# Patient Record
Sex: Male | Born: 1935 | Race: White | Hispanic: No | Marital: Single | Smoking: Former smoker
Health system: Southern US, Community
[De-identification: ages and names within clinical notes are randomized; demographics above are authoritative.]

## PROBLEM LIST (undated history)

## (undated) DIAGNOSIS — I480 Paroxysmal atrial fibrillation: Secondary | ICD-10-CM

## (undated) DIAGNOSIS — I10 Essential (primary) hypertension: Secondary | ICD-10-CM

## (undated) DIAGNOSIS — D45 Polycythemia vera: Principal | ICD-10-CM

## (undated) DIAGNOSIS — D751 Secondary polycythemia: Secondary | ICD-10-CM

## (undated) DIAGNOSIS — N2889 Other specified disorders of kidney and ureter: Secondary | ICD-10-CM

## (undated) HISTORY — DX: Secondary polycythemia: D75.1

## (undated) HISTORY — DX: Polycythemia vera: D45

---

## 2003-11-15 ENCOUNTER — Ambulatory Visit: Payer: Self-pay | Admitting: Internal Medicine

## 2003-12-16 ENCOUNTER — Ambulatory Visit: Payer: Self-pay | Admitting: Internal Medicine

## 2004-01-15 ENCOUNTER — Ambulatory Visit: Payer: Self-pay | Admitting: Internal Medicine

## 2004-02-15 ENCOUNTER — Ambulatory Visit: Payer: Self-pay | Admitting: Internal Medicine

## 2004-03-17 ENCOUNTER — Ambulatory Visit: Payer: Self-pay | Admitting: Internal Medicine

## 2004-04-14 ENCOUNTER — Ambulatory Visit: Payer: Self-pay | Admitting: Internal Medicine

## 2004-05-15 ENCOUNTER — Ambulatory Visit: Payer: Self-pay | Admitting: Internal Medicine

## 2004-06-14 ENCOUNTER — Ambulatory Visit: Payer: Self-pay | Admitting: Internal Medicine

## 2004-07-15 ENCOUNTER — Ambulatory Visit: Payer: Self-pay | Admitting: Internal Medicine

## 2004-08-14 ENCOUNTER — Ambulatory Visit: Payer: Self-pay | Admitting: Internal Medicine

## 2004-10-01 ENCOUNTER — Ambulatory Visit: Payer: Self-pay | Admitting: Internal Medicine

## 2004-10-15 ENCOUNTER — Ambulatory Visit: Payer: Self-pay | Admitting: Internal Medicine

## 2004-11-14 ENCOUNTER — Ambulatory Visit: Payer: Self-pay | Admitting: Internal Medicine

## 2004-12-24 ENCOUNTER — Ambulatory Visit: Payer: Self-pay | Admitting: Internal Medicine

## 2005-01-14 ENCOUNTER — Ambulatory Visit: Payer: Self-pay | Admitting: Internal Medicine

## 2005-02-14 ENCOUNTER — Ambulatory Visit: Payer: Self-pay | Admitting: Internal Medicine

## 2005-03-18 ENCOUNTER — Ambulatory Visit: Payer: Self-pay | Admitting: Internal Medicine

## 2005-04-14 ENCOUNTER — Ambulatory Visit: Payer: Self-pay | Admitting: Internal Medicine

## 2005-05-15 ENCOUNTER — Ambulatory Visit: Payer: Self-pay | Admitting: Internal Medicine

## 2005-06-14 ENCOUNTER — Ambulatory Visit: Payer: Self-pay | Admitting: Internal Medicine

## 2005-07-22 ENCOUNTER — Ambulatory Visit: Payer: Self-pay | Admitting: Internal Medicine

## 2005-08-14 ENCOUNTER — Ambulatory Visit: Payer: Self-pay | Admitting: Internal Medicine

## 2005-09-14 ENCOUNTER — Ambulatory Visit: Payer: Self-pay | Admitting: Internal Medicine

## 2005-10-15 ENCOUNTER — Ambulatory Visit: Payer: Self-pay | Admitting: Internal Medicine

## 2005-11-25 ENCOUNTER — Ambulatory Visit: Payer: Self-pay | Admitting: Internal Medicine

## 2005-12-15 ENCOUNTER — Ambulatory Visit: Payer: Self-pay | Admitting: Internal Medicine

## 2006-01-14 ENCOUNTER — Ambulatory Visit: Payer: Self-pay | Admitting: Internal Medicine

## 2006-02-17 ENCOUNTER — Ambulatory Visit: Payer: Self-pay | Admitting: Internal Medicine

## 2006-03-17 ENCOUNTER — Ambulatory Visit: Payer: Self-pay | Admitting: Internal Medicine

## 2006-04-15 ENCOUNTER — Ambulatory Visit: Payer: Self-pay | Admitting: Internal Medicine

## 2006-05-16 ENCOUNTER — Ambulatory Visit: Payer: Self-pay | Admitting: Internal Medicine

## 2006-06-21 ENCOUNTER — Ambulatory Visit: Payer: Self-pay | Admitting: Internal Medicine

## 2006-07-16 ENCOUNTER — Ambulatory Visit: Payer: Self-pay | Admitting: Internal Medicine

## 2006-08-15 ENCOUNTER — Ambulatory Visit: Payer: Self-pay | Admitting: Internal Medicine

## 2006-09-14 ENCOUNTER — Ambulatory Visit: Payer: Self-pay | Admitting: Internal Medicine

## 2006-09-15 ENCOUNTER — Ambulatory Visit: Payer: Self-pay | Admitting: Internal Medicine

## 2006-12-04 ENCOUNTER — Ambulatory Visit: Payer: Self-pay | Admitting: Internal Medicine

## 2006-12-16 ENCOUNTER — Ambulatory Visit: Payer: Self-pay | Admitting: Internal Medicine

## 2007-02-15 ENCOUNTER — Ambulatory Visit: Payer: Self-pay | Admitting: Internal Medicine

## 2007-03-01 ENCOUNTER — Ambulatory Visit: Payer: Self-pay | Admitting: Internal Medicine

## 2007-03-18 ENCOUNTER — Ambulatory Visit: Payer: Self-pay | Admitting: Internal Medicine

## 2007-04-15 ENCOUNTER — Ambulatory Visit: Payer: Self-pay | Admitting: Internal Medicine

## 2007-05-16 ENCOUNTER — Ambulatory Visit: Payer: Self-pay | Admitting: Internal Medicine

## 2007-06-15 ENCOUNTER — Ambulatory Visit: Payer: Self-pay | Admitting: Internal Medicine

## 2007-07-16 ENCOUNTER — Ambulatory Visit: Payer: Self-pay | Admitting: Internal Medicine

## 2007-08-16 ENCOUNTER — Ambulatory Visit: Payer: Self-pay | Admitting: Internal Medicine

## 2007-09-15 ENCOUNTER — Ambulatory Visit: Payer: Self-pay | Admitting: Internal Medicine

## 2007-10-16 ENCOUNTER — Ambulatory Visit: Payer: Self-pay | Admitting: Internal Medicine

## 2007-11-15 ENCOUNTER — Ambulatory Visit: Payer: Self-pay | Admitting: Internal Medicine

## 2007-12-20 ENCOUNTER — Ambulatory Visit: Payer: Self-pay | Admitting: Internal Medicine

## 2008-01-15 ENCOUNTER — Ambulatory Visit: Payer: Self-pay | Admitting: Internal Medicine

## 2008-02-15 ENCOUNTER — Ambulatory Visit: Payer: Self-pay | Admitting: Internal Medicine

## 2008-03-27 ENCOUNTER — Ambulatory Visit: Payer: Self-pay | Admitting: Internal Medicine

## 2008-04-14 ENCOUNTER — Ambulatory Visit: Payer: Self-pay | Admitting: Internal Medicine

## 2008-05-22 ENCOUNTER — Ambulatory Visit: Payer: Self-pay | Admitting: Internal Medicine

## 2008-06-14 ENCOUNTER — Ambulatory Visit: Payer: Self-pay | Admitting: Internal Medicine

## 2008-07-15 ENCOUNTER — Ambulatory Visit: Payer: Self-pay | Admitting: Internal Medicine

## 2008-07-17 ENCOUNTER — Ambulatory Visit: Payer: Self-pay | Admitting: Internal Medicine

## 2008-08-14 ENCOUNTER — Ambulatory Visit: Payer: Self-pay | Admitting: Internal Medicine

## 2008-09-14 ENCOUNTER — Ambulatory Visit: Payer: Self-pay | Admitting: Internal Medicine

## 2008-11-06 ENCOUNTER — Ambulatory Visit: Payer: Self-pay | Admitting: Internal Medicine

## 2008-11-14 ENCOUNTER — Ambulatory Visit: Payer: Self-pay | Admitting: Internal Medicine

## 2009-01-01 ENCOUNTER — Ambulatory Visit: Payer: Self-pay | Admitting: Internal Medicine

## 2009-01-14 ENCOUNTER — Ambulatory Visit: Payer: Self-pay | Admitting: Internal Medicine

## 2009-02-26 ENCOUNTER — Ambulatory Visit: Payer: Self-pay | Admitting: Internal Medicine

## 2009-03-17 ENCOUNTER — Ambulatory Visit: Payer: Self-pay | Admitting: Internal Medicine

## 2009-04-14 ENCOUNTER — Ambulatory Visit: Payer: Self-pay | Admitting: Internal Medicine

## 2009-04-23 ENCOUNTER — Ambulatory Visit: Payer: Self-pay | Admitting: Internal Medicine

## 2009-05-15 ENCOUNTER — Ambulatory Visit: Payer: Self-pay | Admitting: Internal Medicine

## 2009-07-16 ENCOUNTER — Ambulatory Visit: Payer: Self-pay | Admitting: Internal Medicine

## 2009-08-14 ENCOUNTER — Ambulatory Visit: Payer: Self-pay | Admitting: Internal Medicine

## 2009-09-14 ENCOUNTER — Ambulatory Visit: Payer: Self-pay | Admitting: Internal Medicine

## 2009-10-15 ENCOUNTER — Ambulatory Visit: Payer: Self-pay | Admitting: Internal Medicine

## 2009-12-31 ENCOUNTER — Ambulatory Visit: Payer: Self-pay | Admitting: Internal Medicine

## 2010-01-14 ENCOUNTER — Ambulatory Visit: Payer: Self-pay | Admitting: Internal Medicine

## 2010-03-25 ENCOUNTER — Ambulatory Visit: Payer: Self-pay | Admitting: Internal Medicine

## 2010-04-15 ENCOUNTER — Ambulatory Visit: Payer: Self-pay | Admitting: Internal Medicine

## 2010-06-17 ENCOUNTER — Ambulatory Visit: Payer: Self-pay | Admitting: Internal Medicine

## 2010-07-16 ENCOUNTER — Ambulatory Visit: Payer: Self-pay | Admitting: Internal Medicine

## 2010-09-09 ENCOUNTER — Ambulatory Visit: Payer: Self-pay | Admitting: Internal Medicine

## 2010-09-15 ENCOUNTER — Ambulatory Visit: Payer: Self-pay | Admitting: Internal Medicine

## 2010-12-02 ENCOUNTER — Ambulatory Visit: Payer: Self-pay | Admitting: Internal Medicine

## 2010-12-16 ENCOUNTER — Ambulatory Visit: Payer: Self-pay | Admitting: Internal Medicine

## 2011-02-24 ENCOUNTER — Ambulatory Visit: Payer: Self-pay | Admitting: Internal Medicine

## 2011-02-24 LAB — CBC CANCER CENTER
Basophil #: 0.2 x10 3/mm — ABNORMAL HIGH (ref 0.0–0.1)
Basophil %: 3.8 %
Eosinophil %: 4.4 %
HCT: 47.6 % (ref 40.0–52.0)
HGB: 16.7 g/dL (ref 13.0–18.0)
Lymphocyte #: 1.2 x10 3/mm (ref 1.0–3.6)
Lymphocyte %: 17.8 %
MCV: 100.7 fL — ABNORMAL HIGH (ref 80–100)
Monocyte #: 1 x10 3/mm — ABNORMAL HIGH (ref 0.0–0.7)
Monocyte %: 15.3 %
Neutrophil #: 3.8 x10 3/mm (ref 1.4–6.5)
Neutrophil %: 58.7 %
RBC: 4.72 10*6/uL (ref 4.40–5.90)
WBC: 6.5 x10 3/mm (ref 3.8–10.6)

## 2011-02-24 LAB — FERRITIN: Ferritin (ARMC): 25 ng/mL (ref 8–388)

## 2011-03-18 ENCOUNTER — Ambulatory Visit: Payer: Self-pay | Admitting: Internal Medicine

## 2011-05-19 ENCOUNTER — Ambulatory Visit: Payer: Self-pay | Admitting: Internal Medicine

## 2011-05-19 LAB — CBC CANCER CENTER
Basophil %: 1.1 %
Eosinophil #: 0.5 x10 3/mm (ref 0.0–0.7)
Eosinophil %: 7.2 %
HCT: 47.3 % (ref 40.0–52.0)
HGB: 15.8 g/dL (ref 13.0–18.0)
Lymphocyte #: 1.2 x10 3/mm (ref 1.0–3.6)
Lymphocyte %: 16 %
MCH: 32.2 pg (ref 26.0–34.0)
MCHC: 33.3 g/dL (ref 32.0–36.0)
MCV: 96.7 fL (ref 80–100)
Monocyte #: 0.8 x10 3/mm — ABNORMAL HIGH (ref 0.0–0.7)
Monocyte %: 10.3 %
Neutrophil %: 65.4 %
RBC: 4.89 10*6/uL (ref 4.40–5.90)
WBC: 7.3 x10 3/mm (ref 3.8–10.6)

## 2011-06-15 ENCOUNTER — Ambulatory Visit: Payer: Self-pay | Admitting: Internal Medicine

## 2011-08-11 ENCOUNTER — Ambulatory Visit: Payer: Self-pay | Admitting: Internal Medicine

## 2011-08-11 LAB — CBC CANCER CENTER
Basophil #: 0.1 x10 3/mm (ref 0.0–0.1)
Eosinophil %: 5.4 %
HGB: 15 g/dL (ref 13.0–18.0)
Lymphocyte #: 1.2 x10 3/mm (ref 1.0–3.6)
Lymphocyte %: 17.4 %
MCH: 30.4 pg (ref 26.0–34.0)
MCV: 89 fL (ref 80–100)
Monocyte #: 0.9 x10 3/mm (ref 0.2–1.0)
Neutrophil #: 4.4 x10 3/mm (ref 1.4–6.5)
Neutrophil %: 63.1 %
Platelet: 153 x10 3/mm (ref 150–440)
RDW: 15.7 % — ABNORMAL HIGH (ref 11.5–14.5)
WBC: 7 x10 3/mm (ref 3.8–10.6)

## 2011-08-15 ENCOUNTER — Ambulatory Visit: Payer: Self-pay | Admitting: Internal Medicine

## 2011-11-03 ENCOUNTER — Ambulatory Visit: Payer: Self-pay | Admitting: Internal Medicine

## 2011-11-03 LAB — CBC CANCER CENTER
Basophil #: 0.1 x10 3/mm (ref 0.0–0.1)
Eosinophil #: 0.5 x10 3/mm (ref 0.0–0.7)
HCT: 44.4 % (ref 40.0–52.0)
Lymphocyte #: 1.3 x10 3/mm (ref 1.0–3.6)
MCH: 31.4 pg (ref 26.0–34.0)
MCHC: 34.6 g/dL (ref 32.0–36.0)
MCV: 91 fL (ref 80–100)
Monocyte #: 0.9 x10 3/mm (ref 0.2–1.0)
Platelet: 143 x10 3/mm — ABNORMAL LOW (ref 150–440)
RDW: 15.7 % — ABNORMAL HIGH (ref 11.5–14.5)
WBC: 7.2 x10 3/mm (ref 3.8–10.6)

## 2011-11-15 ENCOUNTER — Ambulatory Visit: Payer: Self-pay | Admitting: Internal Medicine

## 2012-01-26 ENCOUNTER — Ambulatory Visit: Payer: Self-pay | Admitting: Internal Medicine

## 2012-01-26 LAB — CBC CANCER CENTER
Eosinophil %: 7.1 %
HCT: 48.6 % (ref 40.0–52.0)
HGB: 16.8 g/dL (ref 13.0–18.0)
Lymphocyte #: 1.4 x10 3/mm (ref 1.0–3.6)
Lymphocyte %: 20.1 %
MCHC: 34.6 g/dL (ref 32.0–36.0)
Monocyte %: 13.1 %
Neutrophil #: 4 x10 3/mm (ref 1.4–6.5)
Neutrophil %: 58.8 %
Platelet: 145 x10 3/mm — ABNORMAL LOW (ref 150–440)
RDW: 15.8 % — ABNORMAL HIGH (ref 11.5–14.5)
WBC: 6.8 x10 3/mm (ref 3.8–10.6)

## 2012-02-15 ENCOUNTER — Ambulatory Visit: Payer: Self-pay | Admitting: Internal Medicine

## 2012-04-19 ENCOUNTER — Ambulatory Visit: Payer: Self-pay | Admitting: Internal Medicine

## 2012-04-19 LAB — CBC CANCER CENTER
Basophil #: 0.1 x10 3/mm (ref 0.0–0.1)
Eosinophil #: 0.5 x10 3/mm (ref 0.0–0.7)
Eosinophil %: 7.1 %
HCT: 45.7 % (ref 40.0–52.0)
Lymphocyte #: 1.2 x10 3/mm (ref 1.0–3.6)
Lymphocyte %: 18.7 %
MCH: 33.1 pg (ref 26.0–34.0)
MCHC: 34.6 g/dL (ref 32.0–36.0)
MCV: 96 fL (ref 80–100)
Monocyte #: 0.9 x10 3/mm (ref 0.2–1.0)
Monocyte %: 13.7 %
Neutrophil #: 3.8 x10 3/mm (ref 1.4–6.5)
Neutrophil %: 58.3 %
Platelet: 140 x10 3/mm — ABNORMAL LOW (ref 150–440)

## 2012-05-15 ENCOUNTER — Ambulatory Visit: Payer: Self-pay | Admitting: Internal Medicine

## 2012-07-12 ENCOUNTER — Ambulatory Visit: Payer: Self-pay | Admitting: Internal Medicine

## 2012-07-12 LAB — CBC CANCER CENTER
Basophil #: 0.1 x10 3/mm (ref 0.0–0.1)
Eosinophil #: 0.6 x10 3/mm (ref 0.0–0.7)
HGB: 16.2 g/dL (ref 13.0–18.0)
Lymphocyte #: 1.2 x10 3/mm (ref 1.0–3.6)
Lymphocyte %: 15.7 %
MCHC: 35.3 g/dL (ref 32.0–36.0)
MCV: 95 fL (ref 80–100)
Monocyte #: 0.9 x10 3/mm (ref 0.2–1.0)
Monocyte %: 12.7 %
Neutrophil %: 61.8 %
Platelet: 125 x10 3/mm — ABNORMAL LOW (ref 150–440)
RDW: 14.8 % — ABNORMAL HIGH (ref 11.5–14.5)
WBC: 7.4 x10 3/mm (ref 3.8–10.6)

## 2012-07-15 ENCOUNTER — Ambulatory Visit: Payer: Self-pay | Admitting: Internal Medicine

## 2012-10-04 ENCOUNTER — Ambulatory Visit: Payer: Self-pay | Admitting: Internal Medicine

## 2012-10-04 LAB — CBC CANCER CENTER
Basophil #: 0.1 x10 3/mm (ref 0.0–0.1)
Basophil %: 1.2 %
HCT: 48 % (ref 40.0–52.0)
HGB: 17 g/dL (ref 13.0–18.0)
Lymphocyte #: 1.1 x10 3/mm (ref 1.0–3.6)
MCH: 34.7 pg — ABNORMAL HIGH (ref 26.0–34.0)
Neutrophil #: 4.7 x10 3/mm (ref 1.4–6.5)
Neutrophil %: 62.9 %
Platelet: 125 x10 3/mm — ABNORMAL LOW (ref 150–440)

## 2012-10-15 ENCOUNTER — Ambulatory Visit: Payer: Self-pay | Admitting: Internal Medicine

## 2012-12-27 ENCOUNTER — Ambulatory Visit: Payer: Self-pay | Admitting: Internal Medicine

## 2012-12-27 LAB — CBC CANCER CENTER
Basophil #: 0.1 x10 3/mm (ref 0.0–0.1)
Eosinophil %: 7 %
HCT: 50.6 % (ref 40.0–52.0)
Lymphocyte #: 1.4 x10 3/mm (ref 1.0–3.6)
MCHC: 34.4 g/dL (ref 32.0–36.0)
MCV: 97 fL (ref 80–100)
Neutrophil #: 4.3 x10 3/mm (ref 1.4–6.5)
Neutrophil %: 63.5 %
RBC: 5.21 10*6/uL (ref 4.40–5.90)
RDW: 13.5 % (ref 11.5–14.5)

## 2012-12-27 LAB — FERRITIN: Ferritin (ARMC): 22 ng/mL (ref 8–388)

## 2013-01-14 ENCOUNTER — Ambulatory Visit: Payer: Self-pay | Admitting: Internal Medicine

## 2013-02-21 ENCOUNTER — Ambulatory Visit: Payer: Self-pay | Admitting: Internal Medicine

## 2013-02-21 LAB — CBC CANCER CENTER
BASOS ABS: 0.1 x10 3/mm (ref 0.0–0.1)
BASOS PCT: 1.2 %
Eosinophil #: 0.3 x10 3/mm (ref 0.0–0.7)
Eosinophil %: 4.5 %
HCT: 46.8 % (ref 40.0–52.0)
HGB: 16 g/dL (ref 13.0–18.0)
Lymphocyte #: 1.1 x10 3/mm (ref 1.0–3.6)
Lymphocyte %: 15.7 %
MCH: 32.1 pg (ref 26.0–34.0)
MCHC: 34.1 g/dL (ref 32.0–36.0)
MCV: 94 fL (ref 80–100)
Monocyte #: 0.9 x10 3/mm (ref 0.2–1.0)
Monocyte %: 14.1 %
NEUTROS PCT: 64.5 %
Neutrophil #: 4.3 x10 3/mm (ref 1.4–6.5)
Platelet: 141 x10 3/mm — ABNORMAL LOW (ref 150–440)
RBC: 4.96 10*6/uL (ref 4.40–5.90)
RDW: 14.4 % (ref 11.5–14.5)
WBC: 6.7 x10 3/mm (ref 3.8–10.6)

## 2013-03-17 ENCOUNTER — Ambulatory Visit: Payer: Self-pay | Admitting: Internal Medicine

## 2013-04-18 ENCOUNTER — Ambulatory Visit: Payer: Self-pay | Admitting: Internal Medicine

## 2013-04-18 LAB — CBC CANCER CENTER
Basophil #: 0.1 x10 3/mm (ref 0.0–0.1)
Basophil %: 1.3 %
EOS PCT: 6.1 %
Eosinophil #: 0.4 x10 3/mm (ref 0.0–0.7)
HCT: 42.3 % (ref 40.0–52.0)
HGB: 14.3 g/dL (ref 13.0–18.0)
Lymphocyte #: 1.4 x10 3/mm (ref 1.0–3.6)
Lymphocyte %: 20.5 %
MCH: 30.7 pg (ref 26.0–34.0)
MCHC: 33.9 g/dL (ref 32.0–36.0)
MCV: 91 fL (ref 80–100)
MONOS PCT: 14 %
Monocyte #: 1 x10 3/mm (ref 0.2–1.0)
Neutrophil #: 4.1 x10 3/mm (ref 1.4–6.5)
Neutrophil %: 58.1 %
PLATELETS: 153 x10 3/mm (ref 150–440)
RBC: 4.66 10*6/uL (ref 4.40–5.90)
RDW: 14.4 % (ref 11.5–14.5)
WBC: 7 x10 3/mm (ref 3.8–10.6)

## 2013-05-15 ENCOUNTER — Ambulatory Visit: Payer: Self-pay | Admitting: Internal Medicine

## 2013-06-13 ENCOUNTER — Ambulatory Visit: Payer: Self-pay | Admitting: Internal Medicine

## 2013-06-13 LAB — CBC CANCER CENTER
BASOS PCT: 0.6 %
Basophil #: 0 x10 3/mm (ref 0.0–0.1)
EOS ABS: 0.5 x10 3/mm (ref 0.0–0.7)
Eosinophil %: 7.8 %
HCT: 43.7 % (ref 40.0–52.0)
HGB: 14.7 g/dL (ref 13.0–18.0)
LYMPHS ABS: 1.2 x10 3/mm (ref 1.0–3.6)
LYMPHS PCT: 17.5 %
MCH: 29.9 pg (ref 26.0–34.0)
MCHC: 33.6 g/dL (ref 32.0–36.0)
MCV: 89 fL (ref 80–100)
MONO ABS: 1 x10 3/mm (ref 0.2–1.0)
Monocyte %: 14.6 %
Neutrophil #: 4.1 x10 3/mm (ref 1.4–6.5)
Neutrophil %: 59.5 %
Platelet: 145 x10 3/mm — ABNORMAL LOW (ref 150–440)
RBC: 4.92 10*6/uL (ref 4.40–5.90)
RDW: 14.7 % — AB (ref 11.5–14.5)
WBC: 6.9 x10 3/mm (ref 3.8–10.6)

## 2013-06-14 ENCOUNTER — Ambulatory Visit: Payer: Self-pay | Admitting: Internal Medicine

## 2013-08-08 ENCOUNTER — Ambulatory Visit: Payer: Self-pay | Admitting: Internal Medicine

## 2013-08-08 LAB — CBC CANCER CENTER
BASOS ABS: 0.1 x10 3/mm (ref 0.0–0.1)
Basophil %: 1.3 %
EOS ABS: 0.3 x10 3/mm (ref 0.0–0.7)
Eosinophil %: 5 %
HCT: 47.1 % (ref 40.0–52.0)
HGB: 16.2 g/dL (ref 13.0–18.0)
LYMPHS ABS: 1.3 x10 3/mm (ref 1.0–3.6)
Lymphocyte %: 20.1 %
MCH: 30.5 pg (ref 26.0–34.0)
MCHC: 34.5 g/dL (ref 32.0–36.0)
MCV: 89 fL (ref 80–100)
MONO ABS: 0.9 x10 3/mm (ref 0.2–1.0)
Monocyte %: 14.4 %
Neutrophil #: 3.7 x10 3/mm (ref 1.4–6.5)
Neutrophil %: 59.2 %
PLATELETS: 138 x10 3/mm — AB (ref 150–440)
RBC: 5.32 10*6/uL (ref 4.40–5.90)
RDW: 16.1 % — AB (ref 11.5–14.5)
WBC: 6.3 x10 3/mm (ref 3.8–10.6)

## 2013-08-14 ENCOUNTER — Ambulatory Visit: Payer: Self-pay | Admitting: Internal Medicine

## 2013-10-03 ENCOUNTER — Ambulatory Visit: Payer: Self-pay | Admitting: Internal Medicine

## 2013-10-03 LAB — CBC CANCER CENTER
BASOS ABS: 0.1 x10 3/mm (ref 0.0–0.1)
Basophil %: 1.2 %
EOS PCT: 5.3 %
Eosinophil #: 0.4 x10 3/mm (ref 0.0–0.7)
HCT: 41.6 % (ref 40.0–52.0)
HGB: 14.3 g/dL (ref 13.0–18.0)
LYMPHS ABS: 1 x10 3/mm (ref 1.0–3.6)
Lymphocyte %: 15.1 %
MCH: 30.4 pg (ref 26.0–34.0)
MCHC: 34.3 g/dL (ref 32.0–36.0)
MCV: 89 fL (ref 80–100)
MONO ABS: 0.9 x10 3/mm (ref 0.2–1.0)
Monocyte %: 13.5 %
NEUTROS ABS: 4.4 x10 3/mm (ref 1.4–6.5)
NEUTROS PCT: 64.9 %
Platelet: 143 x10 3/mm — ABNORMAL LOW (ref 150–440)
RBC: 4.69 10*6/uL (ref 4.40–5.90)
RDW: 15.2 % — ABNORMAL HIGH (ref 11.5–14.5)
WBC: 6.8 x10 3/mm (ref 3.8–10.6)

## 2013-10-15 ENCOUNTER — Ambulatory Visit: Payer: Self-pay | Admitting: Internal Medicine

## 2013-11-28 ENCOUNTER — Ambulatory Visit: Payer: Self-pay | Admitting: Internal Medicine

## 2013-11-28 LAB — CBC CANCER CENTER
BASOS PCT: 1.1 %
Basophil #: 0.1 x10 3/mm (ref 0.0–0.1)
Eosinophil #: 0.3 x10 3/mm (ref 0.0–0.7)
Eosinophil %: 4.7 %
HCT: 43.1 % (ref 40.0–52.0)
HGB: 15.1 g/dL (ref 13.0–18.0)
Lymphocyte #: 1.2 x10 3/mm (ref 1.0–3.6)
Lymphocyte %: 16.6 %
MCH: 30.6 pg (ref 26.0–34.0)
MCHC: 35.1 g/dL (ref 32.0–36.0)
MCV: 87 fL (ref 80–100)
Monocyte #: 1 x10 3/mm (ref 0.2–1.0)
Monocyte %: 14.1 %
NEUTROS PCT: 63.5 %
Neutrophil #: 4.4 x10 3/mm (ref 1.4–6.5)
Platelet: 150 x10 3/mm (ref 150–440)
RBC: 4.95 10*6/uL (ref 4.40–5.90)
RDW: 15.4 % — AB (ref 11.5–14.5)
WBC: 7 x10 3/mm (ref 3.8–10.6)

## 2013-11-28 LAB — FERRITIN: FERRITIN (ARMC): 21 ng/mL (ref 8–388)

## 2013-12-15 ENCOUNTER — Ambulatory Visit: Payer: Self-pay | Admitting: Internal Medicine

## 2014-05-01 ENCOUNTER — Ambulatory Visit
Admit: 2014-05-01 | Disposition: A | Discharge status: Home / Self Care | Payer: Self-pay | Attending: Internal Medicine | Admitting: Internal Medicine

## 2014-05-01 DIAGNOSIS — D45 Polycythemia vera: Secondary | ICD-10-CM | POA: Diagnosis not present

## 2014-05-16 ENCOUNTER — Ambulatory Visit
Admit: 2014-05-16 | Disposition: A | Discharge status: Home / Self Care | Payer: Self-pay | Attending: Internal Medicine | Admitting: Internal Medicine

## 2014-07-04 ENCOUNTER — Emergency Department: Payer: Medicare Other

## 2014-07-04 ENCOUNTER — Encounter: Payer: Self-pay | Admitting: Emergency Medicine

## 2014-07-04 ENCOUNTER — Observation Stay
Admission: EM | Admit: 2014-07-04 | Discharge: 2014-07-06 | Disposition: A | Discharge status: Home / Self Care | Payer: Medicare Other | Attending: Internal Medicine | Admitting: Internal Medicine

## 2014-07-04 DIAGNOSIS — K4090 Unilateral inguinal hernia, without obstruction or gangrene, not specified as recurrent: Secondary | ICD-10-CM | POA: Diagnosis not present

## 2014-07-04 DIAGNOSIS — W19XXXA Unspecified fall, initial encounter: Secondary | ICD-10-CM | POA: Diagnosis not present

## 2014-07-04 DIAGNOSIS — Y92481 Parking lot as the place of occurrence of the external cause: Secondary | ICD-10-CM | POA: Diagnosis not present

## 2014-07-04 DIAGNOSIS — Z79899 Other long term (current) drug therapy: Secondary | ICD-10-CM | POA: Diagnosis not present

## 2014-07-04 DIAGNOSIS — R410 Disorientation, unspecified: Secondary | ICD-10-CM | POA: Diagnosis not present

## 2014-07-04 DIAGNOSIS — S3993XA Unspecified injury of pelvis, initial encounter: Secondary | ICD-10-CM | POA: Diagnosis not present

## 2014-07-04 DIAGNOSIS — I313 Pericardial effusion (noninflammatory): Secondary | ICD-10-CM | POA: Insufficient documentation

## 2014-07-04 DIAGNOSIS — M4802 Spinal stenosis, cervical region: Secondary | ICD-10-CM | POA: Diagnosis not present

## 2014-07-04 DIAGNOSIS — E871 Hypo-osmolality and hyponatremia: Secondary | ICD-10-CM | POA: Insufficient documentation

## 2014-07-04 DIAGNOSIS — F1010 Alcohol abuse, uncomplicated: Secondary | ICD-10-CM | POA: Diagnosis not present

## 2014-07-04 DIAGNOSIS — I10 Essential (primary) hypertension: Secondary | ICD-10-CM | POA: Insufficient documentation

## 2014-07-04 DIAGNOSIS — S3991XA Unspecified injury of abdomen, initial encounter: Secondary | ICD-10-CM | POA: Diagnosis not present

## 2014-07-04 DIAGNOSIS — R9431 Abnormal electrocardiogram [ECG] [EKG]: Secondary | ICD-10-CM | POA: Diagnosis not present

## 2014-07-04 DIAGNOSIS — R251 Tremor, unspecified: Secondary | ICD-10-CM | POA: Diagnosis not present

## 2014-07-04 DIAGNOSIS — N2889 Other specified disorders of kidney and ureter: Secondary | ICD-10-CM | POA: Insufficient documentation

## 2014-07-04 DIAGNOSIS — S51012A Laceration without foreign body of left elbow, initial encounter: Secondary | ICD-10-CM

## 2014-07-04 DIAGNOSIS — Y900 Blood alcohol level of less than 20 mg/100 ml: Secondary | ICD-10-CM | POA: Insufficient documentation

## 2014-07-04 DIAGNOSIS — S51019A Laceration without foreign body of unspecified elbow, initial encounter: Secondary | ICD-10-CM | POA: Insufficient documentation

## 2014-07-04 DIAGNOSIS — Z87891 Personal history of nicotine dependence: Secondary | ICD-10-CM | POA: Diagnosis not present

## 2014-07-04 DIAGNOSIS — R4182 Altered mental status, unspecified: Secondary | ICD-10-CM | POA: Insufficient documentation

## 2014-07-04 DIAGNOSIS — K5730 Diverticulosis of large intestine without perforation or abscess without bleeding: Secondary | ICD-10-CM | POA: Insufficient documentation

## 2014-07-04 DIAGNOSIS — S299XXA Unspecified injury of thorax, initial encounter: Secondary | ICD-10-CM | POA: Diagnosis not present

## 2014-07-04 DIAGNOSIS — Z7982 Long term (current) use of aspirin: Secondary | ICD-10-CM | POA: Insufficient documentation

## 2014-07-04 DIAGNOSIS — S51011A Laceration without foreign body of right elbow, initial encounter: Secondary | ICD-10-CM

## 2014-07-04 DIAGNOSIS — S199XXA Unspecified injury of neck, initial encounter: Secondary | ICD-10-CM | POA: Diagnosis not present

## 2014-07-04 DIAGNOSIS — N400 Enlarged prostate without lower urinary tract symptoms: Secondary | ICD-10-CM | POA: Diagnosis not present

## 2014-07-04 DIAGNOSIS — I340 Nonrheumatic mitral (valve) insufficiency: Secondary | ICD-10-CM | POA: Insufficient documentation

## 2014-07-04 DIAGNOSIS — I2510 Atherosclerotic heart disease of native coronary artery without angina pectoris: Secondary | ICD-10-CM | POA: Insufficient documentation

## 2014-07-04 DIAGNOSIS — S0990XA Unspecified injury of head, initial encounter: Secondary | ICD-10-CM | POA: Diagnosis not present

## 2014-07-04 DIAGNOSIS — M5136 Other intervertebral disc degeneration, lumbar region: Secondary | ICD-10-CM | POA: Insufficient documentation

## 2014-07-04 DIAGNOSIS — R945 Abnormal results of liver function studies: Secondary | ICD-10-CM | POA: Insufficient documentation

## 2014-07-04 DIAGNOSIS — R17 Unspecified jaundice: Secondary | ICD-10-CM

## 2014-07-04 HISTORY — DX: Essential (primary) hypertension: I10

## 2014-07-04 LAB — URINALYSIS COMPLETE WITH MICROSCOPIC (ARMC ONLY)
Bilirubin Urine: NEGATIVE
Glucose, UA: NEGATIVE mg/dL
Hgb urine dipstick: NEGATIVE
Ketones, ur: NEGATIVE mg/dL
Leukocytes, UA: NEGATIVE
Nitrite: NEGATIVE
PROTEIN: 100 mg/dL — AB
SQUAMOUS EPITHELIAL / LPF: NONE SEEN
Specific Gravity, Urine: 1.01 (ref 1.005–1.030)
pH: 8 (ref 5.0–8.0)

## 2014-07-04 LAB — CBC WITH DIFFERENTIAL/PLATELET
Basophils Absolute: 0.1 10*3/uL (ref 0–0.1)
Basophils Relative: 1 %
Eosinophils Absolute: 0 10*3/uL (ref 0–0.7)
Eosinophils Relative: 0 %
HCT: 41.2 % (ref 40.0–52.0)
HEMOGLOBIN: 14.8 g/dL (ref 13.0–18.0)
Lymphocytes Relative: 5 %
Lymphs Abs: 0.7 10*3/uL — ABNORMAL LOW (ref 1.0–3.6)
MCH: 37.3 pg — ABNORMAL HIGH (ref 26.0–34.0)
MCHC: 35.9 g/dL (ref 32.0–36.0)
MCV: 103.8 fL — ABNORMAL HIGH (ref 80.0–100.0)
MONO ABS: 1 10*3/uL (ref 0.2–1.0)
NEUTROS ABS: 11.1 10*3/uL — AB (ref 1.4–6.5)
Platelets: 167 10*3/uL (ref 150–440)
RBC: 3.97 MIL/uL — ABNORMAL LOW (ref 4.40–5.90)
RDW: 15.5 % — ABNORMAL HIGH (ref 11.5–14.5)
WBC: 12.9 10*3/uL — AB (ref 3.8–10.6)

## 2014-07-04 LAB — URINE DRUG SCREEN, QUALITATIVE (ARMC ONLY)
Amphetamines, Ur Screen: NOT DETECTED
BARBITURATES, UR SCREEN: NOT DETECTED
BENZODIAZEPINE, UR SCRN: NOT DETECTED
CANNABINOID 50 NG, UR ~~LOC~~: NOT DETECTED
Cocaine Metabolite,Ur ~~LOC~~: NOT DETECTED
MDMA (Ecstasy)Ur Screen: NOT DETECTED
METHADONE SCREEN, URINE: NOT DETECTED
Opiate, Ur Screen: NOT DETECTED
PHENCYCLIDINE (PCP) UR S: NOT DETECTED
Tricyclic, Ur Screen: NOT DETECTED

## 2014-07-04 LAB — COMPREHENSIVE METABOLIC PANEL
ALK PHOS: 57 U/L (ref 38–126)
ALT: 26 U/L (ref 17–63)
ANION GAP: 10 (ref 5–15)
AST: 43 U/L — ABNORMAL HIGH (ref 15–41)
Albumin: 4.1 g/dL (ref 3.5–5.0)
BUN: 14 mg/dL (ref 6–20)
CALCIUM: 9.5 mg/dL (ref 8.9–10.3)
CO2: 25 mmol/L (ref 22–32)
Chloride: 97 mmol/L — ABNORMAL LOW (ref 101–111)
Creatinine, Ser: 0.98 mg/dL (ref 0.61–1.24)
GFR calc Af Amer: >60 mL/min (ref >60)
GFR calc non Af Amer: >60 mL/min (ref >60)
Glucose, Bld: 130 mg/dL — ABNORMAL HIGH (ref 65–99)
POTASSIUM: 4 mmol/L (ref 3.5–5.1)
Sodium: 132 mmol/L — ABNORMAL LOW (ref 135–145)
TOTAL PROTEIN: 6.9 g/dL (ref 6.5–8.1)
Total Bilirubin: 3.1 mg/dL — ABNORMAL HIGH (ref 0.3–1.2)

## 2014-07-04 LAB — TYPE AND SCREEN
ABO/RH(D): O POS
Antibody Screen: NEGATIVE

## 2014-07-04 LAB — TROPONIN I: Troponin I: <0.03 ng/mL (ref <0.031)

## 2014-07-04 LAB — APTT: APTT: 33 s (ref 24–36)

## 2014-07-04 LAB — PROTIME-INR
INR: 1.2
PROTHROMBIN TIME: 15.4 s — AB (ref 11.4–15.0)

## 2014-07-04 LAB — LIPASE, BLOOD: Lipase: 49 U/L (ref 22–51)

## 2014-07-04 LAB — ETHANOL: Alcohol, Ethyl (B): <5 mg/dL (ref <5)

## 2014-07-04 MED ORDER — IOHEXOL 300 MG/ML  SOLN
100.0000 mL | Freq: Once | INTRAMUSCULAR | Status: AC | PRN
  Administered 2014-07-04: 100 mL via INTRAVENOUS

## 2014-07-04 MED ORDER — SODIUM CHLORIDE 0.9 % IV BOLUS (SEPSIS)
1000.0000 mL | Freq: Once | INTRAVENOUS | Status: AC
  Administered 2014-07-04: 1000 mL via INTRAVENOUS

## 2014-07-04 MED ORDER — TETANUS-DIPHTHERIA TOXOIDS TD 5-2 LFU IM INJ
0.5000 mL | INJECTION | Freq: Once | INTRAMUSCULAR | Status: AC
  Administered 2014-07-04: 0.5 mL via INTRAMUSCULAR

## 2014-07-04 MED ORDER — TETANUS-DIPHTH-ACELL PERTUSSIS 5-2.5-18.5 LF-MCG/0.5 IM SUSP
INTRAMUSCULAR | Status: AC
Start: 2014-07-04 — End: 2014-07-05
  Filled 2014-07-04: qty 0.5
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
## 2014-07-05 ENCOUNTER — Encounter: Payer: Self-pay | Admitting: Emergency Medicine

## 2014-07-05 ENCOUNTER — Observation Stay: Admit: 2014-07-05 | Payer: Medicare Other

## 2014-07-05 ENCOUNTER — Observation Stay
Admit: 2014-07-05 | Discharge: 2014-07-05 | Disposition: A | Discharge status: Home / Self Care | Payer: Medicare Other | Attending: Internal Medicine | Admitting: Internal Medicine

## 2014-07-05 ENCOUNTER — Observation Stay: Payer: Medicare Other

## 2014-07-05 DIAGNOSIS — I6523 Occlusion and stenosis of bilateral carotid arteries: Secondary | ICD-10-CM | POA: Diagnosis not present

## 2014-07-05 DIAGNOSIS — S0990XA Unspecified injury of head, initial encounter: Secondary | ICD-10-CM | POA: Diagnosis not present

## 2014-07-05 DIAGNOSIS — I10 Essential (primary) hypertension: Secondary | ICD-10-CM | POA: Diagnosis not present

## 2014-07-05 DIAGNOSIS — R410 Disorientation, unspecified: Secondary | ICD-10-CM | POA: Diagnosis not present

## 2014-07-05 DIAGNOSIS — N2889 Other specified disorders of kidney and ureter: Secondary | ICD-10-CM | POA: Diagnosis not present

## 2014-07-05 DIAGNOSIS — I638 Other cerebral infarction: Secondary | ICD-10-CM | POA: Diagnosis not present

## 2014-07-05 LAB — TSH: TSH: 0.908 u[IU]/mL (ref 0.350–4.500)

## 2014-07-05 LAB — ABO/RH
ABO/RH(D): O POS
WEAK D: POSITIVE

## 2014-07-05 MED ORDER — VITAMIN B-1 100 MG PO TABS
100.0000 mg | ORAL_TABLET | Freq: Every day | ORAL | Status: DC
  Administered 2014-07-05 – 2014-07-06 (×2): 100 mg via ORAL
  Filled 2014-07-05 (×2): qty 1

## 2014-07-05 MED ORDER — ZOLPIDEM TARTRATE 5 MG PO TABS: 10.0000 mg | ORAL_TABLET | Freq: Every evening | ORAL | Status: DC | PRN

## 2014-07-05 MED ORDER — ACETAMINOPHEN 650 MG RE SUPP: 650.0000 mg | Freq: Four times a day (QID) | RECTAL | Status: DC | PRN

## 2014-07-05 MED ORDER — LORAZEPAM 1 MG PO TABS: 1.0000 mg | ORAL_TABLET | Freq: Four times a day (QID) | ORAL | Status: DC | PRN

## 2014-07-05 MED ORDER — LORAZEPAM 2 MG/ML IJ SOLN: 1.0000 mg | Freq: Four times a day (QID) | INTRAMUSCULAR | Status: DC | PRN

## 2014-07-05 MED ORDER — THIAMINE HCL 100 MG/ML IJ SOLN
100.0000 mg | Freq: Once | INTRAMUSCULAR | Status: AC
  Administered 2014-07-05: 100 mg via INTRAVENOUS

## 2014-07-05 MED ORDER — THIAMINE HCL 100 MG/ML IJ SOLN
100.0000 mg | Freq: Every day | INTRAMUSCULAR | Status: DC
  Filled 2014-07-05 (×2): qty 1

## 2014-07-05 MED ORDER — ACETAMINOPHEN 325 MG PO TABS: 650.0000 mg | ORAL_TABLET | Freq: Four times a day (QID) | ORAL | Status: DC | PRN

## 2014-07-05 MED ORDER — SODIUM CHLORIDE 0.9 % IJ SOLN
3.0000 mL | Freq: Two times a day (BID) | INTRAMUSCULAR | Status: DC
  Administered 2014-07-05: 3 mL via INTRAVENOUS

## 2014-07-05 MED ORDER — ADULT MULTIVITAMIN W/MINERALS CH
1.0000 | ORAL_TABLET | Freq: Every day | ORAL | Status: DC
  Administered 2014-07-05 – 2014-07-06 (×2): 1 via ORAL
  Filled 2014-07-05 (×3): qty 1

## 2014-07-05 MED ORDER — LISINOPRIL 20 MG PO TABS
20.0000 mg | ORAL_TABLET | Freq: Every day | ORAL | Status: DC
  Administered 2014-07-05 – 2014-07-06 (×2): 20 mg via ORAL
  Filled 2014-07-05 (×2): qty 1

## 2014-07-05 MED ORDER — VITAMIN D 1000 UNITS PO TABS
1000.0000 [IU] | ORAL_TABLET | Freq: Every day | ORAL | Status: DC
  Administered 2014-07-05 – 2014-07-06 (×2): 1000 [IU] via ORAL
  Filled 2014-07-05 (×3): qty 1

## 2014-07-05 MED ORDER — ADULT MULTIVITAMIN W/MINERALS CH: 1.0000 | ORAL_TABLET | Freq: Every day | ORAL | Status: DC

## 2014-07-05 MED ORDER — TAMSULOSIN HCL 0.4 MG PO CAPS
0.4000 mg | ORAL_CAPSULE | Freq: Every day | ORAL | Status: DC
  Administered 2014-07-05 – 2014-07-06 (×2): 0.4 mg via ORAL
  Filled 2014-07-05 (×2): qty 1

## 2014-07-05 MED ORDER — SIMVASTATIN 20 MG PO TABS
20.0000 mg | ORAL_TABLET | Freq: Every day | ORAL | Status: DC
  Administered 2014-07-05: 20 mg via ORAL
  Filled 2014-07-05: qty 1

## 2014-07-05 MED ORDER — DOCUSATE SODIUM 100 MG PO CAPS
100.0000 mg | ORAL_CAPSULE | Freq: Two times a day (BID) | ORAL | Status: DC
Start: 2014-07-05 — End: 2014-07-06
  Administered 2014-07-05 – 2014-07-06 (×3): 100 mg via ORAL
  Filled 2014-07-05 (×3): qty 1

## 2014-07-05 MED ORDER — ZOLPIDEM TARTRATE 5 MG PO TABS
10.0000 mg | ORAL_TABLET | Freq: Every evening | ORAL | Status: DC | PRN
  Administered 2014-07-05: 10 mg via ORAL
  Filled 2014-07-05: qty 2

## 2014-07-05 MED ORDER — FOLIC ACID 1 MG PO TABS
1.0000 mg | ORAL_TABLET | Freq: Once | ORAL | Status: AC
Start: 2014-07-05 — End: 2014-07-05
  Administered 2014-07-05: 1 mg via ORAL

## 2014-07-05 MED ORDER — FOLIC ACID 1 MG PO TABS
1.0000 mg | ORAL_TABLET | Freq: Every day | ORAL | Status: DC
  Administered 2014-07-05 – 2014-07-06 (×2): 1 mg via ORAL
  Filled 2014-07-05 (×2): qty 1

## 2014-07-05 MED ORDER — AMLODIPINE BESYLATE 5 MG PO TABS
5.0000 mg | ORAL_TABLET | Freq: Every day | ORAL | Status: DC
  Administered 2014-07-05 – 2014-07-06 (×2): 5 mg via ORAL
  Filled 2014-07-05 (×2): qty 1

## 2014-07-05 MED ORDER — HYDROCHLOROTHIAZIDE 25 MG PO TABS
25.0000 mg | ORAL_TABLET | Freq: Every day | ORAL | Status: DC
  Administered 2014-07-05: 25 mg via ORAL
  Filled 2014-07-05: qty 1

## 2014-07-05 MED ORDER — FOSINOPRIL SODIUM 20 MG PO TABS: 20.0000 mg | ORAL_TABLET | Freq: Every day | ORAL | Status: DC

## 2014-07-05 MED ORDER — HEPARIN SODIUM (PORCINE) 5000 UNIT/ML IJ SOLN
INTRAMUSCULAR | Status: AC
  Filled 2014-07-05: qty 1

## 2014-07-05 MED ORDER — TETANUS-DIPHTHERIA TOXOIDS TD 5-2 LFU IM INJ
INJECTION | INTRAMUSCULAR | Status: AC
  Filled 2014-07-05: qty 0.5

## 2014-07-05 MED ORDER — HEPARIN SODIUM (PORCINE) 5000 UNIT/ML IJ SOLN
5000.0000 [IU] | Freq: Three times a day (TID) | INTRAMUSCULAR | Status: DC
  Administered 2014-07-05 (×3): 5000 [IU] via SUBCUTANEOUS
  Filled 2014-07-05 (×2): qty 1

## 2014-07-05 MED ORDER — FOLIC ACID 1 MG PO TABS
ORAL_TABLET | ORAL | Status: AC
  Filled 2014-07-05: qty 1

## 2014-07-05 MED ORDER — ATENOLOL 50 MG PO TABS
50.0000 mg | ORAL_TABLET | Freq: Every day | ORAL | Status: DC
  Administered 2014-07-05 – 2014-07-06 (×2): 50 mg via ORAL
  Filled 2014-07-05 (×2): qty 1

## 2014-07-05 MED ORDER — ASPIRIN 81 MG PO CHEW
81.0000 mg | CHEWABLE_TABLET | Freq: Every day | ORAL | Status: DC
  Administered 2014-07-05 – 2014-07-06 (×2): 81 mg via ORAL
  Filled 2014-07-05 (×2): qty 1

## 2014-07-05 MED ORDER — THIAMINE HCL 100 MG/ML IJ SOLN
INTRAMUSCULAR | Status: AC
  Filled 2014-07-05: qty 2

## 2014-07-05 NOTE — ED Provider Notes (Signed)
CTs of the head and neck came come back and show no acute disease areas of moderate to severe multilevel foraminal narrowing CT of the chest shows no acute disease or small fluid density pericardial effusion CT of the abdomen and pelvis comes back shows a left renal mass and densities in the liver and kidney disease will admit the patient with a diagnosis of acute mental status change new onset A. fib will investigate further this renal mass and see if it is something that has known about  Nena Polio, MD 07/07/14 [phone]

## 2014-07-05 NOTE — ED Notes (Signed)
Pt awoken for heparin administration then pt back to sleep.

## 2014-07-05 NOTE — ED Notes (Signed)
Report from mary, rn. Pt going to ct scan.

## 2014-07-05 NOTE — ED Notes (Signed)
Pt sleeping, resps unlabored.  
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
## 2014-07-05 NOTE — ED Notes (Signed)
Pt assisted with urinal by dave, emtp.

## 2014-07-05 NOTE — ED Notes (Signed)
Pt assisted with commode for urine. Pt back to bed. Pt's family informed of admission process. Pt's family states "well if it's gonna take that damn long, the doctor said only twenty minutes." pt's family has opted at this time to go home. Door opened to view pt.

## 2014-07-05 NOTE — Progress Notes (Signed)
*  PRELIMINARY RESULTS* Echocardiogram 2D Echocardiogram has been performed.  Cory Robles 07/05/2014, 5:39 PM
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
## 2014-07-05 NOTE — ED Notes (Signed)
Pt assisted with urinal and back to bed.

## 2014-07-05 NOTE — ED Notes (Signed)
Screening questions asked at 0410 entered under wrong chart, see [phone] for pt's responses.

## 2014-07-05 NOTE — ED Notes (Signed)
Pt up and out of bed urinating on floor. Pt assisted with urinal and placed in bed. Pt alert to self only, requesting daughter to be called. Pt states " why am i here? Am i in [place], where the hell is my stuff, my billfold?" no wallet noted in room or in pt's pants. Pt with tan colored pants, black shoes, tan and black striped shirt, socks, brown glasses and sunglasses noted. No other belongings with pt at this time. Pt confused to day, situation and place. Pt admits to consumption of 4-12 beers nightly. Pt denies etoh ingestion this pm.
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
## 2014-07-05 NOTE — ED Notes (Signed)
Dr. Diamond in to see pt.  
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
## 2014-07-05 NOTE — ED Notes (Signed)
Pt's daughter at bedside. Pt's family updated on treatment plan with pt's consent.

## 2014-07-05 NOTE — ED Notes (Signed)
Pt updated on admission status. Pt verbalizes understanding. Vss. Call bell at side.

## 2014-07-05 NOTE — ED Notes (Signed)
Pt continues to sleep, resps unlabored.  

## 2014-07-06 ENCOUNTER — Observation Stay: Payer: Medicare Other

## 2014-07-06 DIAGNOSIS — R410 Disorientation, unspecified: Secondary | ICD-10-CM | POA: Diagnosis not present

## 2014-07-06 DIAGNOSIS — E871 Hypo-osmolality and hyponatremia: Secondary | ICD-10-CM | POA: Diagnosis not present

## 2014-07-06 DIAGNOSIS — N289 Disorder of kidney and ureter, unspecified: Secondary | ICD-10-CM | POA: Diagnosis not present

## 2014-07-06 DIAGNOSIS — N2889 Other specified disorders of kidney and ureter: Secondary | ICD-10-CM | POA: Diagnosis not present

## 2014-07-06 DIAGNOSIS — R7989 Other specified abnormal findings of blood chemistry: Secondary | ICD-10-CM | POA: Diagnosis not present

## 2014-07-06 LAB — LIPID PANEL
Cholesterol: 112 mg/dL (ref 0–200)
HDL: 57 mg/dL (ref >40)
LDL Cholesterol: 46 mg/dL (ref 0–99)
TRIGLYCERIDES: 44 mg/dL (ref <150)
Total CHOL/HDL Ratio: 2 RATIO
VLDL: 9 mg/dL (ref 0–40)

## 2014-07-06 LAB — COMPREHENSIVE METABOLIC PANEL
ALT: 19 U/L (ref 17–63)
ANION GAP: 6 (ref 5–15)
AST: 27 U/L (ref 15–41)
Albumin: 3.7 g/dL (ref 3.5–5.0)
Alkaline Phosphatase: 53 U/L (ref 38–126)
BUN: 14 mg/dL (ref 6–20)
CALCIUM: 9.1 mg/dL (ref 8.9–10.3)
CO2: 27 mmol/L (ref 22–32)
CREATININE: 0.9 mg/dL (ref 0.61–1.24)
Chloride: 99 mmol/L — ABNORMAL LOW (ref 101–111)
Glucose, Bld: 84 mg/dL (ref 65–99)
Potassium: 3.6 mmol/L (ref 3.5–5.1)
Sodium: 132 mmol/L — ABNORMAL LOW (ref 135–145)
Total Bilirubin: 3.2 mg/dL — ABNORMAL HIGH (ref 0.3–1.2)
Total Protein: 6.1 g/dL — ABNORMAL LOW (ref 6.5–8.1)

## 2014-07-06 LAB — CBC
HCT: 37.9 % — ABNORMAL LOW (ref 40.0–52.0)
Hemoglobin: 13.6 g/dL (ref 13.0–18.0)
MCH: 37.7 pg — ABNORMAL HIGH (ref 26.0–34.0)
MCHC: 35.8 g/dL (ref 32.0–36.0)
MCV: 105.2 fL — ABNORMAL HIGH (ref 80.0–100.0)
Platelets: 139 10*3/uL — ABNORMAL LOW (ref 150–440)
RBC: 3.61 MIL/uL — ABNORMAL LOW (ref 4.40–5.90)
RDW: 15.4 % — ABNORMAL HIGH (ref 11.5–14.5)
WBC: 7.3 10*3/uL (ref 3.8–10.6)

## 2014-07-06 MED ORDER — ASPIRIN 81 MG PO CHEW: 81.0000 mg | CHEWABLE_TABLET | Freq: Every day | ORAL | Status: AC

## 2014-07-06 MED ORDER — ASPIRIN 81 MG PO CHEW: 81.0000 mg | CHEWABLE_TABLET | Freq: Every day | ORAL | Status: DC
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
## 2014-07-06 NOTE — Discharge Instructions (Addendum)
Low sodium and low fat diet. Activity as tolerated. Stop tobacco use. Follow up LFT with PCP.

## 2014-07-09 ENCOUNTER — Other Ambulatory Visit: Payer: Self-pay | Admitting: *Deleted

## 2014-07-09 DIAGNOSIS — D751 Secondary polycythemia: Secondary | ICD-10-CM

## 2014-07-10 ENCOUNTER — Inpatient Hospital Stay: Payer: Medicare Other

## 2014-07-10 ENCOUNTER — Inpatient Hospital Stay: Payer: Medicare Other | Attending: Internal Medicine

## 2014-07-10 DIAGNOSIS — D45 Polycythemia vera: Secondary | ICD-10-CM | POA: Insufficient documentation

## 2014-07-10 DIAGNOSIS — D751 Secondary polycythemia: Secondary | ICD-10-CM

## 2014-07-10 LAB — CBC WITH DIFFERENTIAL/PLATELET
Basophils Absolute: 0 10*3/uL (ref 0–0.1)
Eosinophils Absolute: 0.2 10*3/uL (ref 0–0.7)
Eosinophils Relative: 2 %
HCT: 37.3 % — ABNORMAL LOW (ref 40.0–52.0)
HEMOGLOBIN: 13.6 g/dL (ref 13.0–18.0)
LYMPHS ABS: 0.9 10*3/uL — AB (ref 1.0–3.6)
Lymphocytes Relative: 12 %
MCH: 37.3 pg — ABNORMAL HIGH (ref 26.0–34.0)
MCHC: 36.4 g/dL — AB (ref 32.0–36.0)
MCV: 102.6 fL — ABNORMAL HIGH (ref 80.0–100.0)
Monocytes Absolute: 1.4 10*3/uL — ABNORMAL HIGH (ref 0.2–1.0)
Monocytes Relative: 18 %
Neutro Abs: 5 10*3/uL (ref 1.4–6.5)
Neutrophils Relative %: 67 %
PLATELETS: 156 10*3/uL (ref 150–440)
RBC: 3.64 MIL/uL — ABNORMAL LOW (ref 4.40–5.90)
RDW: 15.5 % — AB (ref 11.5–14.5)
WBC: 7.5 10*3/uL (ref 3.8–10.6)

## 2014-10-30 ENCOUNTER — Inpatient Hospital Stay: Payer: Medicare Other

## 2014-10-30 ENCOUNTER — Inpatient Hospital Stay (HOSPITAL_BASED_OUTPATIENT_CLINIC_OR_DEPARTMENT_OTHER): Payer: Medicare Other | Admitting: Internal Medicine

## 2014-10-30 ENCOUNTER — Inpatient Hospital Stay: Payer: Medicare Other | Attending: Internal Medicine

## 2014-10-30 DIAGNOSIS — K5790 Diverticulosis of intestine, part unspecified, without perforation or abscess without bleeding: Secondary | ICD-10-CM

## 2014-10-30 DIAGNOSIS — I2510 Atherosclerotic heart disease of native coronary artery without angina pectoris: Secondary | ICD-10-CM

## 2014-10-30 DIAGNOSIS — D751 Secondary polycythemia: Secondary | ICD-10-CM | POA: Insufficient documentation

## 2014-10-30 DIAGNOSIS — Z79899 Other long term (current) drug therapy: Secondary | ICD-10-CM

## 2014-10-30 DIAGNOSIS — Z7982 Long term (current) use of aspirin: Secondary | ICD-10-CM | POA: Insufficient documentation

## 2014-10-30 DIAGNOSIS — D696 Thrombocytopenia, unspecified: Secondary | ICD-10-CM | POA: Insufficient documentation

## 2014-10-30 DIAGNOSIS — M545 Low back pain: Secondary | ICD-10-CM | POA: Insufficient documentation

## 2014-10-30 DIAGNOSIS — M4050 Lordosis, unspecified, site unspecified: Secondary | ICD-10-CM | POA: Insufficient documentation

## 2014-10-30 DIAGNOSIS — N2889 Other specified disorders of kidney and ureter: Secondary | ICD-10-CM | POA: Diagnosis not present

## 2014-10-30 DIAGNOSIS — Z87891 Personal history of nicotine dependence: Secondary | ICD-10-CM | POA: Insufficient documentation

## 2014-10-30 DIAGNOSIS — Z8673 Personal history of transient ischemic attack (TIA), and cerebral infarction without residual deficits: Secondary | ICD-10-CM | POA: Diagnosis not present

## 2014-10-30 DIAGNOSIS — R5383 Other fatigue: Secondary | ICD-10-CM

## 2014-10-30 DIAGNOSIS — R221 Localized swelling, mass and lump, neck: Secondary | ICD-10-CM

## 2014-10-30 DIAGNOSIS — I10 Essential (primary) hypertension: Secondary | ICD-10-CM | POA: Insufficient documentation

## 2014-10-30 DIAGNOSIS — Z23 Encounter for immunization: Secondary | ICD-10-CM | POA: Diagnosis not present

## 2014-10-30 LAB — CBC WITH DIFFERENTIAL/PLATELET
Basophils Absolute: 0.1 10*3/uL (ref 0–0.1)
Basophils Relative: 1 %
EOS ABS: 0.3 10*3/uL (ref 0–0.7)
Eosinophils Relative: 3 %
HEMATOCRIT: 45.3 % (ref 40.0–52.0)
HEMOGLOBIN: 15.7 g/dL (ref 13.0–18.0)
LYMPHS ABS: 1.2 10*3/uL (ref 1.0–3.6)
LYMPHS PCT: 12 %
MCH: 33.9 pg (ref 26.0–34.0)
MCHC: 34.6 g/dL (ref 32.0–36.0)
MCV: 98 fL (ref 80.0–100.0)
MONOS PCT: 11 %
Monocytes Absolute: 1.1 10*3/uL — ABNORMAL HIGH (ref 0.2–1.0)
NEUTROS ABS: 7.3 10*3/uL — AB (ref 1.4–6.5)
NEUTROS PCT: 73 %
Platelets: 176 10*3/uL (ref 150–440)
RBC: 4.63 MIL/uL (ref 4.40–5.90)
RDW: 14.8 % — ABNORMAL HIGH (ref 11.5–14.5)
WBC: 9.9 10*3/uL (ref 3.8–10.6)

## 2014-10-30 LAB — FERRITIN: Ferritin: 78 ng/mL (ref 24–336)

## 2014-10-30 NOTE — Progress Notes (Unsigned)
Pt doing ok. States he has no appetite, no taste buds and loves ensure and drinks 4 a day
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
## 2014-11-03 ENCOUNTER — Inpatient Hospital Stay: Payer: Medicare Other

## 2014-11-03 VITALS — BP 99/60 | HR 100 | Temp 97.2°F | Resp 18

## 2014-11-03 DIAGNOSIS — M545 Low back pain: Secondary | ICD-10-CM | POA: Diagnosis not present

## 2014-11-03 DIAGNOSIS — I2510 Atherosclerotic heart disease of native coronary artery without angina pectoris: Secondary | ICD-10-CM | POA: Diagnosis not present

## 2014-11-03 DIAGNOSIS — Z23 Encounter for immunization: Secondary | ICD-10-CM

## 2014-11-03 DIAGNOSIS — Z87891 Personal history of nicotine dependence: Secondary | ICD-10-CM | POA: Diagnosis not present

## 2014-11-03 DIAGNOSIS — D751 Secondary polycythemia: Secondary | ICD-10-CM | POA: Diagnosis not present

## 2014-11-03 DIAGNOSIS — I10 Essential (primary) hypertension: Secondary | ICD-10-CM | POA: Diagnosis not present

## 2014-11-03 DIAGNOSIS — D696 Thrombocytopenia, unspecified: Secondary | ICD-10-CM | POA: Diagnosis not present

## 2014-11-03 DIAGNOSIS — R221 Localized swelling, mass and lump, neck: Secondary | ICD-10-CM | POA: Diagnosis not present

## 2014-11-03 DIAGNOSIS — Z7982 Long term (current) use of aspirin: Secondary | ICD-10-CM | POA: Diagnosis not present

## 2014-11-03 DIAGNOSIS — K5790 Diverticulosis of intestine, part unspecified, without perforation or abscess without bleeding: Secondary | ICD-10-CM | POA: Diagnosis not present

## 2014-11-03 DIAGNOSIS — R5383 Other fatigue: Secondary | ICD-10-CM | POA: Diagnosis not present

## 2014-11-03 DIAGNOSIS — N2889 Other specified disorders of kidney and ureter: Secondary | ICD-10-CM | POA: Diagnosis not present

## 2014-11-03 DIAGNOSIS — Z79899 Other long term (current) drug therapy: Secondary | ICD-10-CM | POA: Diagnosis not present

## 2014-11-03 DIAGNOSIS — M4050 Lordosis, unspecified, site unspecified: Secondary | ICD-10-CM | POA: Diagnosis not present

## 2014-11-03 DIAGNOSIS — Z8673 Personal history of transient ischemic attack (TIA), and cerebral infarction without residual deficits: Secondary | ICD-10-CM | POA: Diagnosis not present

## 2014-11-03 MED ORDER — INFLUENZA VAC SPLIT QUAD 0.5 ML IM SUSY
0.5000 mL | PREFILLED_SYRINGE | Freq: Once | INTRAMUSCULAR | Status: AC
  Administered 2014-11-03: 0.5 mL via INTRAMUSCULAR

## 2014-11-15 DIAGNOSIS — N2889 Other specified disorders of kidney and ureter: Secondary | ICD-10-CM

## 2014-11-15 HISTORY — DX: Other specified disorders of kidney and ureter: N28.89

## 2014-11-27 ENCOUNTER — Ambulatory Visit (INDEPENDENT_AMBULATORY_CARE_PROVIDER_SITE_OTHER): Payer: Medicare Other | Admitting: Urology

## 2014-11-27 ENCOUNTER — Encounter: Payer: Self-pay | Admitting: Urology

## 2014-11-27 VITALS — BP 116/69 | HR 73 | Ht 69.0 in | Wt 149.5 lb

## 2014-11-27 DIAGNOSIS — N2889 Other specified disorders of kidney and ureter: Secondary | ICD-10-CM | POA: Diagnosis not present

## 2014-11-27 DIAGNOSIS — R350 Frequency of micturition: Secondary | ICD-10-CM | POA: Diagnosis not present

## 2014-11-27 LAB — URINALYSIS, COMPLETE
BILIRUBIN UA: NEGATIVE
GLUCOSE, UA: NEGATIVE
KETONES UA: NEGATIVE
Leukocytes, UA: NEGATIVE
Nitrite, UA: NEGATIVE
RBC UA: NEGATIVE
SPEC GRAV UA: 1.02 (ref 1.005–1.030)
UUROB: 0.2 mg/dL (ref 0.2–1.0)
pH, UA: 5.5 (ref 5.0–7.5)

## 2014-11-27 LAB — MICROSCOPIC EXAMINATION
BACTERIA UA: NONE SEEN
Epithelial Cells (non renal): NONE SEEN /hpf (ref 0–10)
RBC, UA: NONE SEEN /hpf (ref 0–?)
Renal Epithel, UA: NONE SEEN /hpf
WBC UA: NONE SEEN /HPF (ref 0–?)

## 2014-11-27 NOTE — Progress Notes (Signed)
11/27/2014 9:56 AM   Cory Robles [birth year] [id number]  Referring provider: No referring provider defined for this encounter.  Chief Complaint  Patient presents with  . Other    new patient with possible tumor    HPI: The patient is a 81 year old male with a past metal history of BPH on Flomax presents with a 3 ml left renal mass on ultrasound. This mass was an incidental finding. He has no symptoms at this time. He denies any history of hematuria. He states he has some frequency. His urinary symptoms are otherwise well controlled on Flomax. He has no urgency, dysuria, nocturia, incontinence, intermittency, straining.   PMH: Past Medical History  Diagnosis Date  . Hypertension   . Erythrocytosis     Surgical History: No past surgical history on file.  Home Medications:    Medication List       This list is accurate as of: 11/27/14  9:56 AM.  Always use your most recent med list.               ALKA-SELTZER ANTACID PO  Take 1 Dose by mouth daily as needed (for upset stomach).     amLODipine 10 MG tablet  Commonly known as:  NORVASC  Take 5 mg by mouth daily.     aspirin 81 MG chewable tablet  Chew 1 tablet (81 mg total) by mouth daily.     atenolol 50 MG tablet  Commonly known as:  TENORMIN  Take 50 mg by mouth daily. 1/2 pill daily     cholecalciferol 1000 UNITS tablet  Commonly known as:  VITAMIN D  Take 1,000 Units by mouth daily.     fosinopril 40 MG tablet  Commonly known as:  MONOPRIL  Take 20 mg by mouth daily.     hydrochlorothiazide 25 MG tablet  Commonly known as:  HYDRODIURIL  Take 25 mg by mouth daily.     tamsulosin 0.4 MG Caps capsule  Commonly known as:  FLOMAX  Take 0.4 mg by mouth.        Allergies: No Known Allergies  Family History: Family History  Problem Relation Age of Onset  . Hypertension Father   . Urolithiasis Neg Hx   . Kidney disease Neg Hx   . Kidney cancer Neg Hx   . Prostate cancer Neg Hx     Social  History:  reports that he has quit smoking. His smokeless tobacco use includes Chew. He reports that he drinks about 16.8 oz of alcohol per week. He reports that he does not use illicit drugs.  ROS: UROLOGY Frequent Urination?: Yes Hard to postpone urination?: No Burning/pain with urination?: No Get up at night to urinate?: No Leakage of urine?: No Urine stream starts and stops?: No Trouble starting stream?: No Do you have to strain to urinate?: No Blood in urine?: No Urinary tract infection?: No Sexually transmitted disease?: No Injury to kidneys or bladder?: No Painful intercourse?: No Weak stream?: No Erection problems?: No Penile pain?: No  Gastrointestinal Nausea?: No Vomiting?: No Indigestion/heartburn?: No Diarrhea?: No Constipation?: No  Constitutional Fever: No Night sweats?: No Weight loss?: No Fatigue?: No  Skin Skin rash/lesions?: No Itching?: No  Eyes Blurred vision?: No Double vision?: No  Ears/Nose/Throat Sore throat?: No Sinus problems?: No  Hematologic/Lymphatic Swollen glands?: No Easy bruising?: No  Cardiovascular Leg swelling?: No Chest pain?: No  Respiratory Cough?: No Shortness of breath?: No  Endocrine Excessive thirst?: No  Musculoskeletal Back pain?: No Joint pain?:  No  Neurological Headaches?: No Dizziness?: No  Psychologic Depression?: No Anxiety?: No  Physical Exam: BP 116/69 mmHg  Pulse 73  Ht 5\' 9"  (1.753 m)  Wt 149 lb 8 oz (67.813 kg)  BMI 22.07 kg/m2  Constitutional:  Alert and oriented, No acute distress. HEENT: [place] AT, moist mucus membranes.  Trachea midline, no masses. Cardiovascular: No clubbing, cyanosis, or edema. Respiratory: Normal respiratory effort, no increased work of breathing. GI: Abdomen is soft, nontender, nondistended, no abdominal masses GU: No CVA tenderness. Normal phallus. Testicles descended equal bilaterally. No masses. Skin: No rashes, bruises or suspicious lesions. Lymph: No  cervical or inguinal adenopathy. Neurologic: Grossly intact, no focal deficits, moving all 4 extremities. Psychiatric: Normal mood and affect.  Laboratory Data: Lab Results  Component Value Date   WBC 9.9 10/30/2014   HGB 15.7 10/30/2014   HCT 45.3 10/30/2014   MCV 98.0 10/30/2014   PLT 176 10/30/2014    Lab Results  Component Value Date   CREATININE 0.90 07/06/2014    No results found for: PSA  No results found for: TESTOSTERONE  No results found for: HGBA1C  Urinalysis    Component Value Date/Time   COLORURINE YELLOW* 07/04/2014 2226   APPEARANCEUR CLEAR* 07/04/2014 2226   LABSPEC 1.010 07/04/2014 2226   PHURINE 8.0 07/04/2014 2226   GLUCOSEU NEGATIVE 07/04/2014 2226   HGBUR NEGATIVE 07/04/2014 2226   BILIRUBINUR NEGATIVE 07/04/2014 2226   [place] 07/04/2014 2226   PROTEINUR 100* 07/04/2014 2226   NITRITE NEGATIVE 07/04/2014 2226   LEUKOCYTESUR NEGATIVE 07/04/2014 2226    Pertinent Imaging: IMPRESSION: Shadowing structure in the gallbladder fossa felt represent the gallbladder filled with gallstones. The gallbladder wall cannot be visualized.  3.4 cm solid lesion in the lower pole of the left kidney concerning for malignancy.  Mildly heterogeneous echotexture could reflect mild fatty infiltration or intrinsic liver disease. No evidence of biliary duct dilatation.   Assessment & Plan:    The patient has a 3.5 cm left renal mass that appears to be peripelvic and is concerning for malignancy. We will get a CT scan to better evaluate this mass. I don't think the patient is a good surgical candidate at this time. If the mass is indeed malignant, I discussed with the patient and his family that cryoablation or watchful waiting would be his best options. They're also aware that if the mass is too close to the hilum that cryoablation may not be an option.   1. Left renal mass Obtain CT renal protocol in follow-up after for results  2.  BPH Continue Flomax daily   Return in about 2 weeks (around 12/11/2014) for after CT to go over results.  Nickie Retort, MD  [facility] [address] [place], [place] [address] [phone]

## 2014-11-28 LAB — BASIC METABOLIC PANEL
BUN/Creatinine Ratio: 26 — ABNORMAL HIGH (ref 10–22)
BUN: 29 mg/dL — AB (ref 8–27)
CALCIUM: 9.7 mg/dL (ref 8.6–10.2)
CO2: 24 mmol/L (ref 18–29)
Chloride: 85 mmol/L — ABNORMAL LOW (ref 97–108)
Creatinine, Ser: 1.11 mg/dL (ref 0.76–1.27)
GFR, EST AFRICAN AMERICAN: 71 mL/min/{1.73_m2} (ref >59)
GFR, EST NON AFRICAN AMERICAN: 61 mL/min/{1.73_m2} (ref >59)
Glucose: 87 mg/dL (ref 65–99)
Potassium: 4.7 mmol/L (ref 3.5–5.2)
Sodium: 126 mmol/L — ABNORMAL LOW (ref 134–144)

## 2014-12-10 ENCOUNTER — Ambulatory Visit
Admission: RE | Admit: 2014-12-10 | Discharge: 2014-12-10 | Disposition: A | Discharge status: Home / Self Care | Payer: Medicare Other | Source: Ambulatory Visit | Attending: Urology | Admitting: Urology

## 2014-12-10 DIAGNOSIS — K4090 Unilateral inguinal hernia, without obstruction or gangrene, not specified as recurrent: Secondary | ICD-10-CM | POA: Insufficient documentation

## 2014-12-10 DIAGNOSIS — N400 Enlarged prostate without lower urinary tract symptoms: Secondary | ICD-10-CM | POA: Diagnosis not present

## 2014-12-10 DIAGNOSIS — N2889 Other specified disorders of kidney and ureter: Secondary | ICD-10-CM | POA: Diagnosis not present

## 2014-12-10 DIAGNOSIS — K5730 Diverticulosis of large intestine without perforation or abscess without bleeding: Secondary | ICD-10-CM | POA: Diagnosis not present

## 2014-12-10 DIAGNOSIS — C642 Malignant neoplasm of left kidney, except renal pelvis: Secondary | ICD-10-CM | POA: Diagnosis not present

## 2014-12-10 MED ORDER — IOHEXOL 350 MG/ML SOLN
100.0000 mL | Freq: Once | INTRAVENOUS | Status: AC | PRN
  Administered 2014-12-10: 100 mL via INTRAVENOUS

## 2014-12-11 ENCOUNTER — Encounter: Payer: Self-pay | Admitting: Urology

## 2014-12-11 ENCOUNTER — Ambulatory Visit (INDEPENDENT_AMBULATORY_CARE_PROVIDER_SITE_OTHER): Payer: Medicare Other | Admitting: Urology

## 2014-12-11 VITALS — BP 109/71 | HR 101 | Ht 67.0 in | Wt 145.0 lb

## 2014-12-11 DIAGNOSIS — N2889 Other specified disorders of kidney and ureter: Secondary | ICD-10-CM

## 2014-12-11 NOTE — Progress Notes (Signed)
12/11/2014 3:24 PM   Jansen Norm Parcel [birth year] [id number]  Referring provider: No referring provider defined for this encounter.  Chief Complaint  Patient presents with  . Results    Ctscan results    HPI: The patient is a 81 y.o. gentleman with multiple medical comorbidities including walking with a walker and is a poor surgical candidate follows up for CT scan results. His CT shows a left peripelvic 3.5 cm mass at hypervascular near the hilum. The patient is asymptomatic from it at this time. Of note, he supports a candidate. He also has a very poor nutritional status only eating a few cans of ensure a day. He walks with a walker. He is adamantly refusing any surgery at this time.   PMH: Past Medical History  Diagnosis Date  . Hypertension   . Erythrocytosis     Surgical History: History reviewed. No pertinent past surgical history.  Home Medications:    Medication List       This list is accurate as of: 12/11/14  3:24 PM.  Always use your most recent med list.               ALKA-SELTZER ANTACID PO  Take 1 Dose by mouth daily as needed (for upset stomach).     amLODipine 10 MG tablet  Commonly known as:  NORVASC  Take 5 mg by mouth daily.     aspirin 81 MG chewable tablet  Chew 1 tablet (81 mg total) by mouth daily.     atenolol 50 MG tablet  Commonly known as:  TENORMIN  Take 50 mg by mouth daily. 1/2 pill daily     cholecalciferol 1000 UNITS tablet  Commonly known as:  VITAMIN D  Take 1,000 Units by mouth daily.     fosinopril 40 MG tablet  Commonly known as:  MONOPRIL  Take 20 mg by mouth daily.     hydrochlorothiazide 25 MG tablet  Commonly known as:  HYDRODIURIL  Take 25 mg by mouth daily.     tamsulosin 0.4 MG Caps capsule  Commonly known as:  FLOMAX  Take 0.4 mg by mouth.        Allergies: No Known Allergies  Family History: Family History  Problem Relation Age of Onset  . Hypertension Father   . Urolithiasis Neg Hx   . Kidney  disease Neg Hx   . Kidney cancer Neg Hx   . Prostate cancer Neg Hx     Social History:  reports that he has quit smoking. His smokeless tobacco use includes Chew. He reports that he drinks about 16.8 oz of alcohol per week. He reports that he does not use illicit drugs.  ROS: UROLOGY Frequent Urination?: No Hard to postpone urination?: No Burning/pain with urination?: No Get up at night to urinate?: No Leakage of urine?: No Urine stream starts and stops?: No Trouble starting stream?: No Do you have to strain to urinate?: No Blood in urine?: No Urinary tract infection?: No Sexually transmitted disease?: No Injury to kidneys or bladder?: No Painful intercourse?: No Weak stream?: No Erection problems?: No Penile pain?: No  Gastrointestinal Nausea?: No Vomiting?: No Indigestion/heartburn?: No Diarrhea?: No Constipation?: No  Constitutional Fever: No Night sweats?: No Weight loss?: No Fatigue?: No  Skin Skin rash/lesions?: No Itching?: No  Eyes Blurred vision?: No Double vision?: No  Ears/Nose/Throat Sore throat?: No Sinus problems?: No  Hematologic/Lymphatic Swollen glands?: No Easy bruising?: No  Cardiovascular Leg swelling?: No Chest pain?: No  Respiratory  Cough?: No Shortness of breath?: No  Endocrine Excessive thirst?: No  Musculoskeletal Back pain?: No Joint pain?: No  Neurological Headaches?: No Dizziness?: No  Psychologic Depression?: No Anxiety?: No  Physical Exam: BP 109/71 mmHg  Pulse 101  Ht 5\' 7"  (1.702 m)  Wt 145 lb (65.772 kg)  BMI 22.71 kg/m2  Constitutional:  Alert and oriented, No acute distress. HEENT: [place] AT, moist mucus membranes.  Trachea midline, no masses. Cardiovascular: No clubbing, cyanosis, or edema. Respiratory: Normal respiratory effort, no increased work of breathing. GI: Abdomen is soft, nontender, nondistended, no abdominal masses GU: No CVA tenderness.  Skin: No rashes, bruises or suspicious  lesions. Lymph: No cervical or inguinal adenopathy. Neurologic: Grossly intact, no focal deficits, moving all 4 extremities. Psychiatric: Normal mood and affect.  Laboratory Data: Lab Results  Component Value Date   WBC 9.9 10/30/2014   HGB 15.7 10/30/2014   HCT 45.3 10/30/2014   MCV 98.0 10/30/2014   PLT 176 10/30/2014    Lab Results  Component Value Date   CREATININE 1.11 11/27/2014    No results found for: PSA  No results found for: TESTOSTERONE  No results found for: HGBA1C  Urinalysis    Component Value Date/Time   COLORURINE YELLOW* 07/04/2014 2226   APPEARANCEUR CLEAR* 07/04/2014 2226   LABSPEC 1.010 07/04/2014 2226   PHURINE 8.0 07/04/2014 2226   GLUCOSEU Negative 11/27/2014 0911   HGBUR NEGATIVE 07/04/2014 2226   BILIRUBINUR Negative 11/27/2014 0911   BILIRUBINUR NEGATIVE 07/04/2014 2226   [place] 07/04/2014 2226   PROTEINUR 100* 07/04/2014 2226   NITRITE Negative 11/27/2014 0911   NITRITE NEGATIVE 07/04/2014 2226   LEUKOCYTESUR Negative 11/27/2014 0911   LEUKOCYTESUR NEGATIVE 07/04/2014 2226    Pertinent Imaging:  CLINICAL DATA: Subsequent encounter for 3.4 cm solid lesion lower pole left kidney concerning for neoplasm on recent ultrasound.  EXAM: CT ABDOMEN AND PELVIS WITHOUT AND WITH CONTRAST  TECHNIQUE: Multidetector CT imaging of the abdomen and pelvis was performed following the standard protocol before and following the bolus administration of intravenous contrast.  CONTRAST: 145mL OMNIPAQUE IOHEXOL 350 MG/ML SOLN  COMPARISON: Ultrasound exam from 07/06/2014. CT scan from 07/04/2014.  FINDINGS: Lower chest: Trace pericardial effusion, as before.  Hepatobiliary: Nodularity of the liver contour raises the question of cirrhosis. Arterial phase imaging shows multiple small areas of hyper enhancement within the parenchyma, some of which are subcapsular in location and others have a more central position. These  measure up to about 9 mm. Multiple stones noted in the gallbladder. No intrahepatic or extrahepatic biliary dilation.  Pancreas: No focal mass lesion. No dilatation of the main duct. No intraparenchymal cyst. No peripancreatic edema.  Spleen: Anterior splenic infarct seen previously now with associated volume loss in the same region compatible with scarring.  Adrenals/Urinary Tract: Stable thickening of both adrenal glands without a discrete or dominant nodule. Cysts are noted within the kidneys bilaterally, measuring up to 3.7 cm in the lower pole right kidney.  The markedly hypervascular mass in the lower pole the right kidney measures 2.5 x 3.5 x 4.2 cm today compared to 2.2 x 3.6 x 4.1 cm previously. The lesion extends into the central sinus fat of the left kidney along its superior aspect (interpolar region). No definite evidence for extension beyond the renal capsule peripherally. Only a single left renal artery is identified. The left renal vein is patent, without evidence for intraluminal filling defect or occlusion.  Bladder is not fully distended, but there is apparent circumferential  bladder wall thickening. Anterior bladder extends out into a relatively large left inguinal hernia.  Stomach/Bowel: Stomach is nondistended. No gastric wall thickening. No evidence of outlet obstruction. Duodenum is normally positioned as is the ligament of Treitz. No small bowel wall thickening. No small bowel dilatation. The terminal ileum is normal. The appendix is normal. Diverticuli are seen scattered along the entire length of the colon without CT findings of diverticulitis.  Vascular/Lymphatic: There is abdominal aortic atherosclerosis without aneurysm. There is no gastrohepatic or hepatoduodenal ligament lymphadenopathy. No intraperitoneal or retroperitoneal lymphadenopy. No pelvic sidewall lymphadenopathy.  Reproductive: Prostate gland is markedly enlarged with a new 17  mm cystic focus in the left aspect of the prostate parenchyma.  Other: No intraperitoneal free fluid.  Musculoskeletal: Bone windows reveal no worrisome lytic or sclerotic osseous lesions. Bilateral inguinal hernias are evident, left greater than right. The anterior bladder extends out into the left inguinal hernia.  IMPRESSION: 1. No substantial change in the markedly hypervascular mass involving the lower left kidney. Imaging features are compatible with renal cell carcinoma. 2. Nodular liver contour suggests cirrhosis. Scattered areas of central and subcapsular hyperenhancement on arterial phase imaging may be related to vascular shunting/vascular malformation, but in the setting of cirrhosis, hepatoma is not excluded. Follow-up is recommended and MRI may prove helpful to further assess. 3. Urinary bladder extends out into a left inguinal hernia. 4. Interval evolution of the anterior splenic infarcts seen previously. 5. Prostatomegaly with new 17 mm cystic focus in the left prostate gland. Abscess not excluded. 6. Atherosclerosis. 7. Colonic diverticulosis without diverticulitis.      Assessment & Plan:    I discussed with the patient and his son that the mass is a renal carcinoma until proven otherwise. We discussed the location of the mass being peripelvic and to me does not seem amenable to cryoablation particularly due to major renal arteries being within a centimeter of the tumor. I did however offer a second opinion from interventional radiologist as to whether or not they would be willing to freeze this mass. The patient and his son are not interested at this time. I discussed with the patient that given his medical comorbidities and his nutritional status, he may not recover from the surgery. He would need a radical nephrectomy as this mass is not amenable to partial nephrectomy. I also discussed t with the patient that he would be at risk for dialysis if he underwent  nephrectomy. He does have a creatinine of only 1.1 now, however, in reviewing his images his kidneys appear to be medically disease. The patient is adamant that he does not want any surgery at this time. They're not interested in a second opinion from interventional radiology on whether or not they would be willing to freeze this mass. They are very well aware that there is a risk of metastasis and death from this mass if left untreated. They're also aware that he may not survive a open operation. With that in mind, we have elected to obtain a renal ultrasound in 6 months to monitor the progression of this mass. If he changes his mind regarding surgery or if more questions arise, he will call us sooner.   1. Renal mass As above  Return in about 6 months (around 06/11/2015) for with renal ultrasound.  Nickie Retort, MD  [facility] [address], [place], [place] [address] [phone]

## 2014-12-25 ENCOUNTER — Inpatient Hospital Stay: Payer: Medicare Other

## 2014-12-25 ENCOUNTER — Inpatient Hospital Stay: Payer: Medicare Other | Attending: Family Medicine

## 2014-12-25 DIAGNOSIS — N2889 Other specified disorders of kidney and ureter: Secondary | ICD-10-CM

## 2014-12-25 DIAGNOSIS — D751 Secondary polycythemia: Secondary | ICD-10-CM | POA: Insufficient documentation

## 2014-12-25 LAB — HEMATOCRIT: HEMATOCRIT: 42.2 % (ref 40.0–52.0)

## 2015-02-19 ENCOUNTER — Other Ambulatory Visit: Payer: Medicare Other

## 2015-02-26 ENCOUNTER — Inpatient Hospital Stay: Payer: Medicare Other | Attending: Family Medicine

## 2015-02-26 ENCOUNTER — Inpatient Hospital Stay: Payer: Medicare Other

## 2015-02-26 ENCOUNTER — Other Ambulatory Visit: Payer: Self-pay | Admitting: *Deleted

## 2015-02-26 DIAGNOSIS — D751 Secondary polycythemia: Secondary | ICD-10-CM | POA: Diagnosis not present

## 2015-02-26 LAB — HEMATOCRIT: HEMATOCRIT: 39.1 % — AB (ref 40.0–52.0)

## 2015-03-18 ENCOUNTER — Emergency Department: Payer: Medicare Other

## 2015-03-18 ENCOUNTER — Inpatient Hospital Stay
Admission: EM | Admit: 2015-03-18 | Discharge: 2015-03-20 | DRG: 640 | Disposition: A | Discharge status: Skilled Nursing Facility | Payer: Medicare Other | Attending: Specialist | Admitting: Specialist

## 2015-03-18 ENCOUNTER — Encounter: Payer: Self-pay | Admitting: *Deleted

## 2015-03-18 ENCOUNTER — Inpatient Hospital Stay
Admit: 2015-03-18 | Discharge: 2015-03-18 | Disposition: A | Discharge status: Home / Self Care | Payer: Medicare Other | Attending: Internal Medicine | Admitting: Internal Medicine

## 2015-03-18 DIAGNOSIS — C649 Malignant neoplasm of unspecified kidney, except renal pelvis: Secondary | ICD-10-CM | POA: Diagnosis present

## 2015-03-18 DIAGNOSIS — G9341 Metabolic encephalopathy: Secondary | ICD-10-CM | POA: Diagnosis present

## 2015-03-18 DIAGNOSIS — F0390 Unspecified dementia without behavioral disturbance: Secondary | ICD-10-CM | POA: Diagnosis present

## 2015-03-18 DIAGNOSIS — D72829 Elevated white blood cell count, unspecified: Secondary | ICD-10-CM | POA: Diagnosis present

## 2015-03-18 DIAGNOSIS — R410 Disorientation, unspecified: Secondary | ICD-10-CM | POA: Diagnosis present

## 2015-03-18 DIAGNOSIS — Z681 Body mass index (BMI) 19 or less, adult: Secondary | ICD-10-CM | POA: Diagnosis not present

## 2015-03-18 DIAGNOSIS — I4891 Unspecified atrial fibrillation: Secondary | ICD-10-CM | POA: Diagnosis not present

## 2015-03-18 DIAGNOSIS — Z9181 History of falling: Secondary | ICD-10-CM | POA: Diagnosis not present

## 2015-03-18 DIAGNOSIS — Z79899 Other long term (current) drug therapy: Secondary | ICD-10-CM | POA: Diagnosis not present

## 2015-03-18 DIAGNOSIS — Z7982 Long term (current) use of aspirin: Secondary | ICD-10-CM | POA: Diagnosis not present

## 2015-03-18 DIAGNOSIS — M79609 Pain in unspecified limb: Secondary | ICD-10-CM | POA: Diagnosis not present

## 2015-03-18 DIAGNOSIS — Z87891 Personal history of nicotine dependence: Secondary | ICD-10-CM | POA: Diagnosis not present

## 2015-03-18 DIAGNOSIS — I480 Paroxysmal atrial fibrillation: Secondary | ICD-10-CM

## 2015-03-18 DIAGNOSIS — S51012A Laceration without foreign body of left elbow, initial encounter: Secondary | ICD-10-CM | POA: Diagnosis present

## 2015-03-18 DIAGNOSIS — G3189 Other specified degenerative diseases of nervous system: Secondary | ICD-10-CM | POA: Diagnosis present

## 2015-03-18 DIAGNOSIS — E860 Dehydration: Secondary | ICD-10-CM | POA: Diagnosis present

## 2015-03-18 DIAGNOSIS — E875 Hyperkalemia: Secondary | ICD-10-CM | POA: Diagnosis not present

## 2015-03-18 DIAGNOSIS — I4819 Other persistent atrial fibrillation: Secondary | ICD-10-CM | POA: Insufficient documentation

## 2015-03-18 DIAGNOSIS — I481 Persistent atrial fibrillation: Secondary | ICD-10-CM | POA: Diagnosis present

## 2015-03-18 DIAGNOSIS — N400 Enlarged prostate without lower urinary tract symptoms: Secondary | ICD-10-CM | POA: Diagnosis present

## 2015-03-18 DIAGNOSIS — E871 Hypo-osmolality and hyponatremia: Principal | ICD-10-CM | POA: Diagnosis present

## 2015-03-18 DIAGNOSIS — I951 Orthostatic hypotension: Secondary | ICD-10-CM | POA: Diagnosis present

## 2015-03-18 DIAGNOSIS — S3993XA Unspecified injury of pelvis, initial encounter: Secondary | ICD-10-CM | POA: Diagnosis not present

## 2015-03-18 DIAGNOSIS — S51011A Laceration without foreign body of right elbow, initial encounter: Secondary | ICD-10-CM | POA: Diagnosis present

## 2015-03-18 DIAGNOSIS — R531 Weakness: Secondary | ICD-10-CM | POA: Diagnosis not present

## 2015-03-18 DIAGNOSIS — I4510 Unspecified right bundle-branch block: Secondary | ICD-10-CM | POA: Diagnosis not present

## 2015-03-18 DIAGNOSIS — R296 Repeated falls: Secondary | ICD-10-CM | POA: Diagnosis present

## 2015-03-18 DIAGNOSIS — I10 Essential (primary) hypertension: Secondary | ICD-10-CM | POA: Diagnosis not present

## 2015-03-18 DIAGNOSIS — M6281 Muscle weakness (generalized): Secondary | ICD-10-CM | POA: Diagnosis not present

## 2015-03-18 DIAGNOSIS — W19XXXA Unspecified fall, initial encounter: Secondary | ICD-10-CM | POA: Insufficient documentation

## 2015-03-18 DIAGNOSIS — Z66 Do not resuscitate: Secondary | ICD-10-CM | POA: Diagnosis present

## 2015-03-18 DIAGNOSIS — N2889 Other specified disorders of kidney and ureter: Secondary | ICD-10-CM | POA: Diagnosis not present

## 2015-03-18 DIAGNOSIS — E43 Unspecified severe protein-calorie malnutrition: Secondary | ICD-10-CM | POA: Diagnosis present

## 2015-03-18 DIAGNOSIS — R4182 Altered mental status, unspecified: Secondary | ICD-10-CM | POA: Diagnosis not present

## 2015-03-18 DIAGNOSIS — Z741 Need for assistance with personal care: Secondary | ICD-10-CM | POA: Diagnosis not present

## 2015-03-18 DIAGNOSIS — S299XXA Unspecified injury of thorax, initial encounter: Secondary | ICD-10-CM | POA: Diagnosis not present

## 2015-03-18 HISTORY — DX: Other specified disorders of kidney and ureter: N28.89

## 2015-03-18 HISTORY — DX: Paroxysmal atrial fibrillation: I48.0

## 2015-03-18 LAB — COMPREHENSIVE METABOLIC PANEL
ALBUMIN: 4.2 g/dL (ref 3.5–5.0)
ALK PHOS: 89 U/L (ref 38–126)
ALT: 29 U/L (ref 17–63)
AST: 45 U/L — AB (ref 15–41)
Anion gap: 11 (ref 5–15)
BILIRUBIN TOTAL: 3.6 mg/dL — AB (ref 0.3–1.2)
BUN: 52 mg/dL — AB (ref 6–20)
CALCIUM: 9.8 mg/dL (ref 8.9–10.3)
CO2: 25 mmol/L (ref 22–32)
Chloride: 85 mmol/L — ABNORMAL LOW (ref 101–111)
Creatinine, Ser: 1.24 mg/dL (ref 0.61–1.24)
GFR calc Af Amer: >60 mL/min (ref >60)
GFR calc non Af Amer: 52 mL/min — ABNORMAL LOW (ref >60)
GLUCOSE: 107 mg/dL — AB (ref 65–99)
Potassium: 5.6 mmol/L — ABNORMAL HIGH (ref 3.5–5.1)
Sodium: 121 mmol/L — ABNORMAL LOW (ref 135–145)
TOTAL PROTEIN: 7.5 g/dL (ref 6.5–8.1)

## 2015-03-18 LAB — CBC
HEMATOCRIT: 36 % — AB (ref 40.0–52.0)
Hemoglobin: 12.8 g/dL — ABNORMAL LOW (ref 13.0–18.0)
MCH: 33.9 pg (ref 26.0–34.0)
MCHC: 35.5 g/dL (ref 32.0–36.0)
MCV: 95.5 fL (ref 80.0–100.0)
PLATELETS: 153 10*3/uL (ref 150–440)
RBC: 3.77 MIL/uL — ABNORMAL LOW (ref 4.40–5.90)
RDW: 16.3 % — AB (ref 11.5–14.5)
WBC: 12.9 10*3/uL — AB (ref 3.8–10.6)

## 2015-03-18 LAB — CREATININE, SERUM
Creatinine, Ser: 1.32 mg/dL — ABNORMAL HIGH (ref 0.61–1.24)
GFR calc Af Amer: 56 mL/min — ABNORMAL LOW (ref >60)
GFR calc non Af Amer: 48 mL/min — ABNORMAL LOW (ref >60)

## 2015-03-18 LAB — URINALYSIS COMPLETE WITH MICROSCOPIC (ARMC ONLY)
Bilirubin Urine: NEGATIVE
Glucose, UA: NEGATIVE mg/dL
Hgb urine dipstick: NEGATIVE
Ketones, ur: NEGATIVE mg/dL
LEUKOCYTES UA: NEGATIVE
Nitrite: NEGATIVE
PH: 5 (ref 5.0–8.0)
PROTEIN: NEGATIVE mg/dL
SPECIFIC GRAVITY, URINE: 1.013 (ref 1.005–1.030)

## 2015-03-18 LAB — CBC WITH DIFFERENTIAL/PLATELET
BASOS ABS: 0 10*3/uL (ref 0–0.1)
BASOS PCT: 0 %
Eosinophils Absolute: 0 10*3/uL (ref 0–0.7)
Eosinophils Relative: 0 %
HEMATOCRIT: 41.7 % (ref 40.0–52.0)
HEMOGLOBIN: 14.7 g/dL (ref 13.0–18.0)
Lymphocytes Relative: 8 %
Lymphs Abs: 1.1 10*3/uL (ref 1.0–3.6)
MCH: 33.2 pg (ref 26.0–34.0)
MCHC: 35.2 g/dL (ref 32.0–36.0)
MCV: 94.5 fL (ref 80.0–100.0)
MONOS PCT: 7 %
Monocytes Absolute: 1.1 10*3/uL — ABNORMAL HIGH (ref 0.2–1.0)
NEUTROS ABS: 12.6 10*3/uL — AB (ref 1.4–6.5)
NEUTROS PCT: 85 %
Platelets: 155 10*3/uL (ref 150–440)
RBC: 4.41 MIL/uL (ref 4.40–5.90)
RDW: 16.1 % — ABNORMAL HIGH (ref 11.5–14.5)
WBC: 14.9 10*3/uL — ABNORMAL HIGH (ref 3.8–10.6)

## 2015-03-18 LAB — CK: CK TOTAL: 77 U/L (ref 49–397)

## 2015-03-18 LAB — TSH: TSH: 0.64 u[IU]/mL (ref 0.350–4.500)

## 2015-03-18 LAB — TROPONIN I: Troponin I: <0.03 ng/mL (ref <0.031)

## 2015-03-18 MED ORDER — TAMSULOSIN HCL 0.4 MG PO CAPS
0.4000 mg | ORAL_CAPSULE | Freq: Every day | ORAL | Status: DC
  Administered 2015-03-19 – 2015-03-20 (×2): 0.4 mg via ORAL
  Filled 2015-03-18 (×2): qty 1

## 2015-03-18 MED ORDER — ONDANSETRON HCL 4 MG PO TABS: 4.0000 mg | ORAL_TABLET | Freq: Four times a day (QID) | ORAL | Status: DC | PRN

## 2015-03-18 MED ORDER — INSULIN ASPART 100 UNIT/ML ~~LOC~~ SOLN
SUBCUTANEOUS | Status: AC
  Administered 2015-03-18: 10 [IU] via INTRAVENOUS
  Filled 2015-03-18: qty 10

## 2015-03-18 MED ORDER — DEXTROSE 50 % IV SOLN
25.0000 mL | Freq: Once | INTRAVENOUS | Status: AC
  Administered 2015-03-18: 25 mL via INTRAVENOUS
  Filled 2015-03-18: qty 50

## 2015-03-18 MED ORDER — VITAMIN D 1000 UNITS PO TABS
1000.0000 [IU] | ORAL_TABLET | Freq: Every day | ORAL | Status: DC
  Administered 2015-03-18 – 2015-03-20 (×3): 1000 [IU] via ORAL
  Filled 2015-03-18 (×3): qty 1

## 2015-03-18 MED ORDER — SODIUM CHLORIDE 0.9 % IV SOLN
Freq: Once | INTRAVENOUS | Status: AC
  Administered 2015-03-18: 12:00:00 via INTRAVENOUS

## 2015-03-18 MED ORDER — ACETAMINOPHEN 325 MG PO TABS: 650.0000 mg | ORAL_TABLET | Freq: Four times a day (QID) | ORAL | Status: DC | PRN

## 2015-03-18 MED ORDER — ACETAMINOPHEN 650 MG RE SUPP: 650.0000 mg | Freq: Four times a day (QID) | RECTAL | Status: DC | PRN

## 2015-03-18 MED ORDER — ENOXAPARIN SODIUM 40 MG/0.4ML ~~LOC~~ SOLN
40.0000 mg | SUBCUTANEOUS | Status: DC
  Administered 2015-03-18 – 2015-03-19 (×2): 40 mg via SUBCUTANEOUS
  Filled 2015-03-18 (×2): qty 0.4

## 2015-03-18 MED ORDER — ATENOLOL 50 MG PO TABS
50.0000 mg | ORAL_TABLET | Freq: Every day | ORAL | Status: DC
  Administered 2015-03-18: 50 mg via ORAL
  Filled 2015-03-18 (×2): qty 1
  Filled 2015-03-18: qty 2

## 2015-03-18 MED ORDER — ONDANSETRON HCL 4 MG/2ML IJ SOLN
4.0000 mg | Freq: Four times a day (QID) | INTRAMUSCULAR | Status: DC | PRN
  Administered 2015-03-18: 23:00:00 4 mg via INTRAVENOUS
  Filled 2015-03-18: qty 2

## 2015-03-18 MED ORDER — SODIUM CHLORIDE 0.9 % IV SOLN
INTRAVENOUS | Status: DC
  Administered 2015-03-18 – 2015-03-19 (×2): via INTRAVENOUS

## 2015-03-18 MED ORDER — ENSURE ENLIVE PO LIQD
237.0000 mL | Freq: Two times a day (BID) | ORAL | Status: DC
  Administered 2015-03-19: 10:00:00 237 mL via ORAL

## 2015-03-18 MED ORDER — SENNOSIDES-DOCUSATE SODIUM 8.6-50 MG PO TABS
1.0000 | ORAL_TABLET | Freq: Every evening | ORAL | Status: DC | PRN
Start: 2015-03-18 — End: 2015-03-20

## 2015-03-18 MED ORDER — ALUM & MAG HYDROXIDE-SIMETH 200-200-20 MG/5ML PO SUSP: 30.0000 mL | Freq: Four times a day (QID) | ORAL | Status: DC | PRN

## 2015-03-18 MED ORDER — HYDROCODONE-ACETAMINOPHEN 5-325 MG PO TABS
1.0000 | ORAL_TABLET | ORAL | Status: DC | PRN
  Administered 2015-03-18: 2 via ORAL
  Administered 2015-03-18: 1 via ORAL
  Filled 2015-03-18: qty 1
  Filled 2015-03-18: qty 2

## 2015-03-18 MED ORDER — INSULIN REGULAR HUMAN 100 UNIT/ML IJ SOLN: 10.0000 [IU] | Freq: Once | INTRAMUSCULAR | Status: DC

## 2015-03-18 MED ORDER — INSULIN ASPART 100 UNIT/ML IV SOLN
10.0000 [IU] | Freq: Once | INTRAVENOUS | Status: AC
  Administered 2015-03-18: 10 [IU] via INTRAVENOUS
  Filled 2015-03-18: qty 0.1

## 2015-03-18 NOTE — ED Notes (Signed)
Spoke with Network engineer. RN is busy doing discharge with another patient. Will call back when finished

## 2015-03-18 NOTE — Clinical Social Work Note (Signed)
Clinical Social Work Assessment  Patient Details  Name: Cory Robles MRN: [id number] Date of Birth: [birth year]  Date of referral:  03/18/15               Reason for consult:  Family Concerns, Other (Comment Required) (Dementia)                Permission sought to share information with:  Facility Sport and exercise psychologist, Guardian, Family Supports Permission granted to share information::  Yes, Verbal Permission Granted  Name::     Patient gave consent and his daughter was called Budd Palmer [phone]  Agency::  yes  Relationship::  yes  Contact Information:  yes  Housing/Transportation Living arrangements for the past 2 months:  Mobile Home Source of Information:  Patient, Adult Children Patient Interpreter Needed:  None Criminal Activity/Legal Involvement Pertinent to Current Situation/Hospitalization:  No - Comment as needed Significant Relationships:  Adult Children, Friend Lives with:  Adult Children, Self Do you feel safe going back to the place where you live?  Yes (Son Alroy Dust will stay with him once rehab done) Need for family participation in patient care:     Care giving concerns:  PT consult , patient would benefit strengthening    Social Worker assessment / plan:  PT and rehab for 30 day and patient can be returned home, son to live him to ensure safety  Employment status:  Retired Insurance underwriter information:   Medicare [place] PT Recommendations:  Not assessed at this time Information / Referral to community resources:   To provide family with Short term ALF/SNF  Patient/Family's Response to care:  Rehab then home  Patient/Family's Understanding of and Emotional Response to Diagnosis, Current Treatment, and Prognosis:  They feel he has early onset dementia and weakness  Emotional Assessment Appearance:  Appears stated age Attitude/Demeanor/Rapport:  Reactive Affect (typically observed):  Calm, Blunt, Appropriate, Apprehensive, Happy, Guarded,  Accepting Orientation:  Oriented to Self, Oriented to Place, Oriented to Situation, Fluctuating Orientation (Suspected and/or reported Sundowners) Alcohol / Substance use:  Alcohol Use (2-3 cans of beer a day) Psych involvement (Current and /or in the community):  No (Comment)  Discharge Needs  Concerns to be addressed:  Home Safety Concerns, Decision making concerns Readmission within the last 30 days:  No Current discharge risk:  Lives alone Barriers to Discharge:  Unsafe home situation   Joana Reamer, LCSW 03/18/2015, 5:54 PM

## 2015-03-18 NOTE — H&P (Signed)
Cory Robles NAME: Cory Robles    MR#:  [id number]  DATE OF BIRTH:  Nov 22, 1929  DATE OF ADMISSION:  03/18/2015  PRIMARY CARE PHYSICIAN: Linus Mako, NP   REQUESTING/REFERRING PHYSICIAN: Dr Jimmye Norman  CHIEF COMPLAINT:  Confusion and fall  HISTORY OF PRESENT ILLNESS:  Cory Robles  is a 82 y.o. male with a known history of kidney mass and essential HTN who presents with his family for above issue. One of his daughters came to visit him who has not seen him in several months and found him on the floor lying in his back and confused. Patient is beginning to have some memory loss as per the daughters at bedside. However today he was more confused than usual and the fact that he was lying in his back and had fallen was about concerned. He is watching emergency room for further evaluation. In the emergency room he was noted have a sodium level of 121. Family says patient has had a poor appetite for several months. He was diagnosed in October with a kidney mass. He did not want surgery for the kidney mass which according to the urologist is likely renal cancer. Patient does state he drinks approximately 1 can of beer on most days of the week.  PAST MEDICAL HISTORY:   Past Medical History  Diagnosis Date  . Hypertension   . Erythrocytosis    left peripelvic 3.5 cm renal mass  PAST SURGICAL HISTORY:    none  SOCIAL HISTORY:   Social History  Substance Use Topics  . Smoking status: Former Research scientist (life sciences)  . Smokeless tobacco: no    Types:   . Alcohol Use:  one beer a day              FAMILY HISTORY:   Family History  Problem Relation Age of Onset  . Hypertension Father   . Urolithiasis Neg Hx   . Kidney disease Neg Hx   . Kidney cancer Neg Hx   . Prostate cancer Neg Hx     DRUG ALLERGIES:  No Known Allergies   REVIEW OF SYSTEMS:  CONSTITUTIONAL: No fever, ++ for fatigue and generalized weakness.  EYES: No blurred or  double vision.  EARS, NOSE, AND THROAT: No tinnitus or ear pain.  RESPIRATORY: No cough, shortness of breath, wheezing or hemoptysis.  CARDIOVASCULAR: No chest pain, orthopnea, edema.  GASTROINTESTINAL: No nausea, vomiting, diarrhea or abdominal pain.  GENITOURINARY: No dysuria, hematuria.  positive history of renal mass  ENDOCRINE: No polyuria, nocturia,  HEMATOLOGY: No anemia, easy bruising or bleeding SKIN: No rash or lesion. MUSCULOSKELETAL: No joint pain or arthritis.  positive osteopenia and falls  NEUROLOGIC: No tingling, numbness, weakness.  PSYCHIATRY: No anxiety or depression.   MEDICATIONS AT HOME:   Prior to Admission medications   Medication Sig Start Date End Date Taking? Authorizing Provider  amLODipine (NORVASC) 10 MG tablet Take 5 mg by mouth daily.   Yes Historical Provider, MD  atenolol (TENORMIN) 50 MG tablet Take 50 mg by mouth daily.    Yes Historical Provider, MD  Calcium Carbonate Antacid (ALKA-SELTZER ANTACID PO) Take 1 Dose by mouth daily as needed (for upset stomach).   Yes Historical Provider, MD  cholecalciferol (VITAMIN D) 1000 UNITS tablet Take 1,000 Units by mouth daily.   Yes Historical Provider, MD  fosinopril (MONOPRIL) 40 MG tablet Take 20 mg by mouth daily.   Yes Historical Provider, MD  hydrochlorothiazide (HYDRODIURIL) 25  MG tablet Take 12.5 mg by mouth daily.    Yes Historical Provider, MD  tamsulosin (FLOMAX) 0.4 MG CAPS capsule Take 0.4 mg by mouth.   Yes Historical Provider, MD  aspirin 81 MG chewable tablet Chew 1 tablet (81 mg total) by mouth daily. 07/06/14   Demetrios Loll, MD      VITAL SIGNS:  Blood pressure 101/71, pulse 65, temperature 97 F (36.1 C), resp. rate 19, height 6' (1.829 m), weight 65.8 kg (145 lb 1 oz), SpO2 96 %.  PHYSICAL EXAMINATION:  GENERAL:  82 y.o.-year-old patient lying in the bed with no acute distress.  frail appearing EYES: Pupils equal, round, reactive to light and accommodation. No scleral icterus. Extraocular  muscles intact.  HEENT: Head atraumatic, normocephalic. Oropharynx clear.  NECK:  Supple, no jugular venous distention. No thyroid enlargement, no tenderness.  LUNGS: Normal breath sounds bilaterally, no wheezing, rales,rhonchi or crepitation. No use of accessory muscles of respiration.  CARDIOVASCULAR: S1, S2 normal. No murmurs, rubs, or gallops.  ABDOMEN: Soft, nontender, nondistended. Bowel sounds present. No organomegaly or mass.  EXTREMITIES: No pedal edema, cyanosis, or clubbing.  NEUROLOGIC: Cranial nerves II through XII are grossly intact. No focal deficits. PSYCHIATRIC: The patient is alert and oriented x 3.  SKIN: No obvious rash, lesion, or ulcer.   LABORATORY PANEL:   CBC  Recent Labs Lab 03/18/15 1057  WBC 14.9*  HGB 14.7  HCT 41.7  PLT 155   ------------------------------------------------------------------------------------------------------------------  Chemistries   Recent Labs Lab 03/18/15 1057  NA 121*  K 5.6*  CL 85*  CO2 25  GLUCOSE 107*  BUN 52*  CREATININE 1.24  CALCIUM 9.8  AST 45*  ALT 29  ALKPHOS 89  BILITOT 3.6*   ------------------------------------------------------------------------------------------------------------------  Cardiac Enzymes  Recent Labs Lab 03/18/15 1057  TROPONINI <0.03   ------------------------------------------------------------------------------------------------------------------  RADIOLOGY:  Dg Chest 2 View  03/18/2015  CLINICAL DATA:  Dementia, altered mental status, fall today EXAM: CHEST  2 VIEW COMPARISON:  07/04/2014 FINDINGS: Cardiomegaly is noted. Mild elevation of the left hemidiaphragm. No acute infiltrate or pulmonary edema. Osteopenia and mild degenerative changes thoracolumbar spine. There is mild compression deformity upper lumbar spine of indeterminate age. Clinical correlation is necessary. IMPRESSION: No acute infiltrate or pulmonary edema. Osteopenia and mild degenerative changes  thoracolumbar spine. There is mild compression deformity upper lumbar spine of indeterminate age. Clinical correlation is necessary. Electronically Signed   By: Lahoma Crocker M.D.   On: 03/18/2015 11:41   Dg Pelvis 1-2 Views  03/18/2015  CLINICAL DATA:  Fall, left forearm skin tear EXAM: PELVIS - 1-2 VIEW COMPARISON:  None. FINDINGS: Atherosclerotic calcifications bilateral femoral artery. Bilateral hip joints are symmetrical in appearance. No acute fracture or subluxation. IMPRESSION: Negative. Electronically Signed   By: Lahoma Crocker M.D.   On: 03/18/2015 11:42   Ct Head Wo Contrast  03/18/2015  CLINICAL DATA:  Altered mental status.  Dementia. EXAM: CT HEAD WITHOUT CONTRAST TECHNIQUE: Contiguous axial images were obtained from the base of the skull through the vertex without intravenous contrast. COMPARISON:  CT brain 07/04/2014 FINDINGS: There is no evidence of mass effect, midline shift, or extra-axial fluid collections. There is no evidence of a space-occupying lesion or intracranial hemorrhage. There is no evidence of a cortical-based area of acute infarction. There is generalized cerebral atrophy. There is periventricular white matter low attenuation likely secondary to microangiopathy. The ventricles and sulci are appropriate for the patient's age. The basal cisterns are patent. Visualized portions of the orbits are  unremarkable. The visualized portions of the paranasal sinuses and mastoid air cells are unremarkable. Cerebrovascular atherosclerotic calcifications are noted. The osseous structures are unremarkable. IMPRESSION: 1. No acute intracranial pathology. 2. Chronic microvascular disease and cerebral atrophy. Electronically Signed   By: Kathreen Devoid   On: 03/18/2015 12:05    EKG:   Atrial fibrillation heart rate 71  IMPRESSION AND PLAN:   82 year old male with known history of renal mass that has opted not to have surgery and further evaluation, essential hypertension and poor by mouth intake  who presents with confusion and hyponatremia.  1. Metabolic encephalopathy:This is from hyponatremia on top of early cognitive impairment. UA is pending to evaluate for urinary tract infection as another etiology of metabolic encephalopathy.  2. Hyponatremia: This is from poor by mouth intake. I will check TSH. I started IV fluids. Repeat sodium level later this afternoon to make sure that the sodium level is increasing.  3. New onset atrial fibrillation: Heart rate is controlled. Patient is not a candidate for anticoagulation due to falls. Continue telemetry. 2-D echocardiogram and cardiology consult ordered.  4. Falls and generalized weakness: Physical therapy consultation.   5. Kidney mass: Patient's last office visit with the urologist was October 2016. Patient did not want a second opinion from interventional radiology or surgery. He is planned for follow-up within the next 2-3 months to monitor the progression of the mass.  6. Poor by mouth appetite: This appears to be a chronic issue. I am suspecting this is in part due to kidney mass. I will order dietary consultation. Patient states he does drink ENSURE so I will order this for him.      All the records are reviewed and case discussed with ED provider. Management plans discussed with the patient and family and they are  in agreement.  CODE STATUS: DNR  TOTAL TIME TAKING CARE OF THIS PATIENT: 45 minutes.    Venissa Nappi M.D on 03/18/2015 at 1:04 PM  Between 7am to 6pm - Pager - [phone] After 6pm go to [email or web address] - password [email or web address] [facility]  [facility]  Office  [phone]  CC: Primary care physician; Linus Mako, NP

## 2015-03-18 NOTE — ED Provider Notes (Addendum)
[facility] Emergency Department Provider Note   Level V caveat: Review of systems and history is limited by dementia  Time seen: ----------------------------------------- 10:48 AM on 03/18/2015 -----------------------------------------    I have reviewed the triage vital signs and the nursing notes.   HISTORY  Chief Complaint No chief complaint on file.    HPI Cory Robles is a 82 y.o. male who presents ER for altered mental status.According to report a daughter who had not seen him in months came to visit and thought he was acting differently. Patient has developed dementia recently. Patient also fallen and sustained some skin tears to his elbows, he denies any complaints at this time.   Past Medical History  Diagnosis Date  . Hypertension   . Erythrocytosis     Patient Active Problem List   Diagnosis Date Noted  . Erythrocytosis 10/30/2014  . Confusion 07/05/2014    No past surgical history on file.  Allergies Review of patient's allergies indicates no known allergies.  Social History Social History  Substance Use Topics  . Smoking status: Former Research scientist (life sciences)  . Smokeless tobacco: Current User    Types: Chew  . Alcohol Use: 16.8 oz/week    28 Cans of beer per week     Comment: 2 beers a day    Review of Systems  Skin: Positive for skin tears on the elbows Neurological: Positive for change in mental status  10-point ROS otherwise unknown. Patient denies complaints.  ____________________________________________   PHYSICAL EXAM:  VITAL SIGNS: ED Triage Vitals  Enc Vitals Group     BP --      Pulse --      Resp --      Temp --      Temp src --      SpO2 --      Weight --      Height --      Head Cir --      Peak Flow --      Pain Score --      Pain Loc --      Pain Edu? --      Excl. in [place]? --     Constitutional: Alert but disoriented Well appearing and in no distress. Eyes: Conjunctivae are normal. PERRL. Normal  extraocular movements. ENT   Head: Normocephalic and atraumatic.   Nose: No congestion/rhinnorhea.   Mouth/Throat: Mucous membranes are somewhat dry   Neck: No stridor. Cardiovascular: Normal rate, regular rhythm. Normal and symmetric distal pulses are present in all extremities. No murmurs, rubs, or gallops. Respiratory: Normal respiratory effort without tachypnea nor retractions. Breath sounds are clear and equal bilaterally. No wheezes/rales/rhonchi. Gastrointestinal: Soft and nontender. No distention. No abdominal bruits.  Genitourinary: Bilateral inguinal hernias are present, left greater than right, nontender. Musculoskeletal: Nontender with normal range of motion in all extremities. No joint effusions.  No lower extremity tenderness nor edema. Neurologic:  Normal speech and language. No gross focal neurologic deficits are appreciated.  Skin:  Skin is warm, dry and intact. No rash noted. ____________________________________________  EKG: Interpreted by me. Atrial fibrillation with a rate of 71 bpm, wide QRS, right bundle branch block, normal QT interval. Normal axis.  ____________________________________________  ED COURSE:  Pertinent labs & imaging results that were available during my care of the patient were reviewed by me and considered in my medical decision making (see chart for details). We'll check basic labs, CT imaging and chest x-Jacquan. ____________________________________________    LABS (pertinent  positives/negatives)  Labs Reviewed  CBC WITH DIFFERENTIAL/PLATELET - Abnormal; Notable for the following:    WBC 14.9 (*)    RDW 16.1 (*)    Neutro Abs 12.6 (*)    Monocytes Absolute 1.1 (*)    All other components within normal limits  COMPREHENSIVE METABOLIC PANEL - Abnormal; Notable for the following:    Sodium 121 (*)    Potassium 5.6 (*)    Chloride 85 (*)    Glucose, Bld 107 (*)    BUN 52 (*)    AST 45 (*)    Total Bilirubin 3.6 (*)    GFR calc  non Af Amer 52 (*)    All other components within normal limits  TROPONIN I  CK  URINALYSIS COMPLETEWITH MICROSCOPIC ([facility] ONLY)    RADIOLOGY Images were viewed by me  CT head, chest x-Seyon, pelvis IMPRESSION: 1. No acute intracranial pathology. 2. Chronic microvascular disease and cerebral atrophy. IMPRESSION: No acute infiltrate or pulmonary edema. Osteopenia and mild degenerative changes thoracolumbar spine. There is mild compression deformity upper lumbar spine of indeterminate age. Clinical correlation is necessary. IMPRESSION: Negative. ____________________________________________  FINAL ASSESSMENT AND PLAN  Altered mental status, dementia, fall, hyponatremia, hyperkalemia  Plan: Patient with labs and imaging as dictated above. I started given the patient normal saline to correct the vallecula imbalances. He could possibly have a mild lumbar compression fracture from the fall but otherwise has just sustained some skin tears. Patient does appear to be developing mild dementia, would benefit from observation. Earleen Newport, MD   Earleen Newport, MD 03/18/15 [place], MD 03/18/15 [id number]

## 2015-03-18 NOTE — ED Notes (Signed)
Patient in CT and Xray.  

## 2015-03-18 NOTE — Progress Notes (Signed)
LCSW met with patient and asked if we could do an assessment. He agreed. Patient was polite but had a hard time hearing. He also was not oriented to time he thought it was June 1985. He reports he has great vision just blood pressure issues. He stated he lives with his brother TQ when they both divorced. He was able to mention he has 3 kids 2 daughters and his baby son Cory Robles ( 78). Patient was friendly and able to answer simple questions. He volunteered he fell and couldn't get up. He shared he doesn't cook himself he eats out even when he isnt hungry. He reports he lives in a trailer and his daughter's  Cory Robles comes by every day. Dad drinks 2-3 cans of beer per day.  Patients stories were substantiated however he is not oriented to TIME, Long term memory recall good.  LCSW will finish and enter collected data for assessment in the morning.     In speaking with family patient will return to trailer and son Cory Robles will be living with him. Family would like him to go to   Rehab for strengthening.  Family members will be coming to visit with him tonight ( son in Sports coach) daughter, or son. [employer] LCSW [phone]

## 2015-03-18 NOTE — ED Notes (Signed)
Pt is from home with dementia, pt is alert oriented to name, EMS reports pt has had an unwitnessed fall today, skin tear noted to left and right forearm

## 2015-03-19 ENCOUNTER — Encounter: Payer: Self-pay | Admitting: Student

## 2015-03-19 DIAGNOSIS — N2889 Other specified disorders of kidney and ureter: Secondary | ICD-10-CM | POA: Insufficient documentation

## 2015-03-19 DIAGNOSIS — I4819 Other persistent atrial fibrillation: Secondary | ICD-10-CM | POA: Insufficient documentation

## 2015-03-19 DIAGNOSIS — I951 Orthostatic hypotension: Secondary | ICD-10-CM | POA: Insufficient documentation

## 2015-03-19 DIAGNOSIS — E871 Hypo-osmolality and hyponatremia: Principal | ICD-10-CM

## 2015-03-19 DIAGNOSIS — I481 Persistent atrial fibrillation: Secondary | ICD-10-CM

## 2015-03-19 DIAGNOSIS — I480 Paroxysmal atrial fibrillation: Secondary | ICD-10-CM

## 2015-03-19 DIAGNOSIS — R410 Disorientation, unspecified: Secondary | ICD-10-CM

## 2015-03-19 DIAGNOSIS — R531 Weakness: Secondary | ICD-10-CM | POA: Insufficient documentation

## 2015-03-19 DIAGNOSIS — W19XXXA Unspecified fall, initial encounter: Secondary | ICD-10-CM

## 2015-03-19 DIAGNOSIS — E875 Hyperkalemia: Secondary | ICD-10-CM | POA: Insufficient documentation

## 2015-03-19 DIAGNOSIS — E43 Unspecified severe protein-calorie malnutrition: Secondary | ICD-10-CM | POA: Insufficient documentation

## 2015-03-19 LAB — CBC
HCT: 34.9 % — ABNORMAL LOW (ref 40.0–52.0)
HEMOGLOBIN: 12.6 g/dL — AB (ref 13.0–18.0)
MCH: 34 pg (ref 26.0–34.0)
MCHC: 36.1 g/dL — AB (ref 32.0–36.0)
MCV: 94.3 fL (ref 80.0–100.0)
Platelets: 126 10*3/uL — ABNORMAL LOW (ref 150–440)
RBC: 3.7 MIL/uL — ABNORMAL LOW (ref 4.40–5.90)
RDW: 16.1 % — AB (ref 11.5–14.5)
WBC: 9.4 10*3/uL (ref 3.8–10.6)

## 2015-03-19 LAB — BASIC METABOLIC PANEL
ANION GAP: 8 (ref 5–15)
BUN: 47 mg/dL — ABNORMAL HIGH (ref 6–20)
CALCIUM: 8.9 mg/dL (ref 8.9–10.3)
CO2: 23 mmol/L (ref 22–32)
CREATININE: 1.04 mg/dL (ref 0.61–1.24)
Chloride: 96 mmol/L — ABNORMAL LOW (ref 101–111)
GLUCOSE: 86 mg/dL (ref 65–99)
Potassium: 4.7 mmol/L (ref 3.5–5.1)
Sodium: 127 mmol/L — ABNORMAL LOW (ref 135–145)

## 2015-03-19 MED ORDER — SODIUM CHLORIDE 0.9 % IV SOLN
INTRAVENOUS | Status: DC
  Administered 2015-03-19 (×2): via INTRAVENOUS

## 2015-03-19 MED ORDER — ENSURE ENLIVE PO LIQD
237.0000 mL | Freq: Three times a day (TID) | ORAL | Status: DC
  Administered 2015-03-19 – 2015-03-20 (×4): 237 mL via ORAL

## 2015-03-19 NOTE — Clinical Social Work Note (Signed)
CSW spoke with pt's daughter, Santiago Glad. Pt's daughter, Butch Penny, is the POA. PT is recommending STR at SNF. Bed search initiated. CSW will follow up with bed offers.   Darden Dates, MSW, LCSW Clinical Social Worker  [phone]

## 2015-03-19 NOTE — NC FL2 (Signed)
[place] LEVEL OF CARE SCREENING TOOL     IDENTIFICATION  Patient Name: Cory Robles Birthdate: [birth year] Sex: male Admission Date (Current Location): 03/18/2015  [place] and [place] Number:  Engineering geologist and Address:  [facility], [address] [place], [place] [address]      Provider Number: [id number]  Attending Physician Name and Address:  Henreitta Leber, MD  Relative Name and Phone Number:       Current Level of Care: Hospital Recommended Level of Care: [place] Prior Approval Number:    Date Approved/Denied:   PASRR Number:   [id number]  Discharge Plan: SNF    Current Diagnoses: Patient Active Problem List   Diagnosis Date Noted  . PAF (paroxysmal atrial fibrillation) ([place]) 03/19/2015  . Protein-calorie malnutrition, severe 03/19/2015  . Fall   . Hyperkalemia   . Weakness   . Renal mass   . Persistent atrial fibrillation ([place])   . Orthostatic hypotension   . Hyponatremia 03/18/2015  . Erythrocytosis 10/30/2014  . Confusion 07/05/2014    Orientation RESPIRATION BLADDER Height & Weight     Self, Situation, Place  Normal, O2 Continent Weight: 145 lb 1 oz (65.8 kg) Height:  6' (182.9 cm)  BEHAVIORAL SYMPTOMS/MOOD NEUROLOGICAL BOWEL NUTRITION STATUS  Other (Comment) (Early onset demetia)   Continent Diet (Normal -high calorie-no appetite)  AMBULATORY STATUS COMMUNICATION OF NEEDS Skin   Supervision Verbally Normal                       Personal Care Assistance Level of Assistance  Bathing, Feeding, Dressing Bathing Assistance: Limited assistance Feeding assistance: Limited assistance Dressing Assistance: Limited assistance     Functional Limitations Info  Sight, Hearing, Speech Sight Info: Adequate Hearing Info: Impaired Speech Info: Adequate    SPECIAL CARE FACTORS FREQUENCY  PT (By licensed PT)     PT Frequency: 3x week ( TBD)              Contractures       Additional Factors Info                  Current Medications (03/19/2015):  This is the current hospital active medication list Current Facility-Administered Medications  Medication Dose Route Frequency Provider Last Rate Last Dose  . 0.9 %  sodium chloride infusion   Intravenous Continuous Henreitta Leber, MD 50 mL/hr at 03/19/15 1101    . acetaminophen (TYLENOL) tablet 650 mg  650 mg Oral Q6H PRN Bettey Costa, MD       Or  . acetaminophen (TYLENOL) suppository 650 mg  650 mg Rectal Q6H PRN Bettey Costa, MD      . alum & mag hydroxide-simeth (MAALOX/MYLANTA) 200-200-20 MG/5ML suspension 30 mL  30 mL Oral Q6H PRN Sital Mody, MD      . atenolol (TENORMIN) tablet 50 mg  50 mg Oral Daily Bettey Costa, MD   50 mg at 03/18/15 1633  . cholecalciferol (VITAMIN D) tablet 1,000 Units  1,000 Units Oral Daily Bettey Costa, MD   1,000 Units at 03/19/15 1003  . enoxaparin (LOVENOX) injection 40 mg  40 mg Subcutaneous Q24H Bettey Costa, MD   40 mg at 03/18/15 2236  . feeding supplement (ENSURE ENLIVE) (ENSURE ENLIVE) liquid 237 mL  237 mL Oral TID BM Henreitta Leber, MD   237 mL at 03/19/15 1400  . HYDROcodone-acetaminophen (NORCO/VICODIN) 5-325 MG per tablet 1-2 tablet  1-2 tablet Oral  Q4H PRN Bettey Costa, MD   2 tablet at 03/18/15 2241  . ondansetron (ZOFRAN) tablet 4 mg  4 mg Oral Q6H PRN Bettey Costa, MD       Or  . ondansetron (ZOFRAN) injection 4 mg  4 mg Intravenous Q6H PRN Bettey Costa, MD   4 mg at 03/18/15 2253  . senna-docusate (Senokot-S) tablet 1 tablet  1 tablet Oral QHS PRN Bettey Costa, MD      . tamsulosin (FLOMAX) capsule 0.4 mg  0.4 mg Oral QPC breakfast Bettey Costa, MD   0.4 mg at 03/19/15 1000     Discharge Medications: Please see discharge summary for a list of discharge medications.  Relevant Imaging Results:  Relevant Lab Results:   Additional Information [id number]  Joana Reamer, [place]

## 2015-03-19 NOTE — Progress Notes (Signed)
LCSW completed FL2 and obtained PASSR [id number] and pasted it.   Hermelinda Diegel LCSW

## 2015-03-19 NOTE — Progress Notes (Signed)
[place] with Dr Jannifer Franklin.  Pt had not urinated on this shift. Bladder scan shows 350 cc.  Order to insert foley cath.  Went to room to insert foley cath and pt motivated to urinate and was able to urinate 125 cc.  Later pt urinated 50 cc.  Will continue to monitor intake and output. Dorna Bloom RN

## 2015-03-19 NOTE — Consult Note (Signed)
Cardiology Consult    Patient ID: Cory Robles MRN: [id number], DOB/AGE: [birth year]   Admit date: 03/18/2015 Date of Consult: 03/19/2015  Primary Physician: Linus Mako, NP Reason for Consult: Atrial Fibrillation Primary Cardiologist: New to [facility] - Dr. Rockey Situ Requesting Provider: Dr. Benjie Karvonen  Patient Profile    82 y.o. male w/ PMH of HTN, a kidney mass (diagnosed in 11/2014 - no plans for surgical treatment), and worsening short-term memory who presented to [facility] on 03/18/2015 after being found on the floor by his daughter yesterday and noted to have increased confusion. Cardiology consulted for "new-onset" atrial fibrillation.   History of Present Illness    Cory Robles is a 82 y.o. male with past medical history of HTN, a kidney mass (diagnosed in 11/2014 - no plans for surgical treatment at this time although diagnosis is likely renal cancer), and recently worsening short-term memory who presented to [facility] on 03/18/2015 after being found on the floor by his daughter yesterday and noted to have increased confusion.  Upon arrival to the ED he was noted to be hyponatremic with a sodium of 121. K+ was 5.6. Creatinine elevated to 1.24. WBC 14.9. Hgb 14.7. Platelets at 155. UA was negative for nitrites. Initial troponin was negative. CXR showed no acute infiltrates or edema. There was a mild compression deformity of the upper lumbar spine. CT imaging of the head showed no acute intracranial pathology. His EKG showed atrial fibrillation with HR of 71 and a RBBB.   In talking with the patient today, he is very pleasant yet confused. He is alert and oriented X1 (self) but is unsure of what hospital he is at and believes it is 49. He also thinks it is Fall. Unfortunately, no family members are present during the encounter. In reviewing previous records, there is mention of his declining memory over the past several months. He lives by himself currently but his son is planning to live with him  following discharge. He reports ambulating with a walker at home. Records also show he has poor oral intake at home and mostly consumes Ensure supplements and 1-2 cans of beer per day.  He currently denies any chest pain or shortness of breath. Upon entering the room, his nasal cannula was resting on his forehead. After helping his reposition it correctly, he removed it again due to it being "annoying" and saying "I can breathe just fine without it". He is having a productive cough and is tearing the tissues that are at his bedside into tiny pieces, saying "it gives me something to do".  Denies any prior cardiac history that he is aware of.    In reviewing his medical records, his EKG from 06/2014 also showed atrial fibrillation with an incomplete RBBB which was obtained during an admission for "confusion". An MRI of the brain was obtained during that admission which showed no acute CVA.  I do not see any prior cardiac history listed in EPIC or Care Everywhere.   Past Medical History   Past Medical History  Diagnosis Date  . Hypertension   . Erythrocytosis   . Renal mass 11/2014    Left    History reviewed. No pertinent past surgical history.   Allergies  No Known Allergies  Inpatient Medications    . atenolol  50 mg Oral Daily  . cholecalciferol  1,000 Units Oral Daily  . enoxaparin (LOVENOX) injection  40 mg Subcutaneous Q24H  . feeding supplement (ENSURE ENLIVE)  237 mL Oral  TID BM  . tamsulosin  0.4 mg Oral QPC breakfast    Family History    Family History  Problem Relation Age of Onset  . Hypertension Father   . Urolithiasis Neg Hx   . Kidney disease Neg Hx   . Kidney cancer Neg Hx   . Prostate cancer Neg Hx     Social History    Social History   Social History  . Marital Status: Single    Spouse Name: N/A  . Number of Children: N/A  . Years of Education: N/A   Occupational History  . Not on file.   Social History Main Topics  . Smoking status: Former  Research scientist (life sciences)  . Smokeless tobacco: Current User    Types: Chew  . Alcohol Use: 8.4 oz/week    14 Cans of beer per week     Comment: 1-2 beers a day  . Drug Use: No  . Sexual Activity: No   Other Topics Concern  . Not on file   Social History Narrative     Review of Systems    Thorough ROS unable to be obtained due to altered mental status/ worsening dementia. He currently denies any chest pain or trouble breathing though. He does have a productive cough.   Physical Exam    Blood pressure 81/69, pulse 75, temperature 98 F (36.7 C), temperature source Oral, resp. rate 20, height 6' (1.829 m), weight 145 lb 1 oz (65.8 kg), SpO2 99 %.  General: Pleasant, elderly, frail, Caucasian male appearing in NAD Psych: Normal affect. Neuro: Alert and oriented X 1. Unsure of what hospital he is at. Believes it is Fall of 1985. Moves all extremities spontaneously. HEENT: Normal  Neck: Supple without bruits or JVD. Lungs:  Resp regular and unlabored, Decreased breath sounds at bases bilaterally. No wheezing or rales appreciated. Heart: Irregularly irregular, no s3, s4, or murmurs. Abdomen: Soft, non-tender, non-distended, BS + x 4.  Extremities: No clubbing, cyanosis or edema. DP/PT/Radials 2+ and equal bilaterally. Left arm with skin tear.  Labs    Troponin (Point of Care Test) No results for input(s): TROPIPOC in the last 72 hours.  Recent Labs  03/18/15 1057  CKTOTAL 77  TROPONINI <0.03   Lab Results  Component Value Date   WBC 9.4 03/19/2015   HGB 12.6* 03/19/2015   HCT 34.9* 03/19/2015   MCV 94.3 03/19/2015   PLT 126* 03/19/2015    Recent Labs Lab 03/18/15 1057  03/19/15 0628  NA 121*  --  127*  K 5.6*  --  4.7  CL 85*  --  96*  CO2 25  --  23  BUN 52*  --  47*  CREATININE 1.24  < > 1.04  CALCIUM 9.8  --  8.9  PROT 7.5  --   --   BILITOT 3.6*  --   --   ALKPHOS 89  --   --   ALT 29  --   --   AST 45*  --   --   GLUCOSE 107*  --  86  < > = values in this interval not  displayed. Lab Results  Component Value Date   CHOL 112 07/06/2014   HDL 57 07/06/2014   LDLCALC 46 07/06/2014   TRIG 44 07/06/2014   No results found for: [facility]   Radiology Studies    Dg Chest 2 View: 03/18/2015  CLINICAL DATA:  Dementia, altered mental status, fall today EXAM: CHEST  2 VIEW COMPARISON:  07/04/2014  FINDINGS: Cardiomegaly is noted. Mild elevation of the left hemidiaphragm. No acute infiltrate or pulmonary edema. Osteopenia and mild degenerative changes thoracolumbar spine. There is mild compression deformity upper lumbar spine of indeterminate age. Clinical correlation is necessary. IMPRESSION: No acute infiltrate or pulmonary edema. Osteopenia and mild degenerative changes thoracolumbar spine. There is mild compression deformity upper lumbar spine of indeterminate age. Clinical correlation is necessary. Electronically Signed   By: Lahoma Crocker M.D.   On: 03/18/2015 11:41   Dg Pelvis 1-2 Views: 03/18/2015  CLINICAL DATA:  Fall, left forearm skin tear EXAM: PELVIS - 1-2 VIEW COMPARISON:  None. FINDINGS: Atherosclerotic calcifications bilateral femoral artery. Bilateral hip joints are symmetrical in appearance. No acute fracture or subluxation. IMPRESSION: Negative. Electronically Signed   By: Lahoma Crocker M.D.   On: 03/18/2015 11:42   Ct Head Wo Contrast: 03/18/2015  CLINICAL DATA:  Altered mental status.  Dementia. EXAM: CT HEAD WITHOUT CONTRAST TECHNIQUE: Contiguous axial images were obtained from the base of the skull through the vertex without intravenous contrast. COMPARISON:  CT brain 07/04/2014 FINDINGS: There is no evidence of mass effect, midline shift, or extra-axial fluid collections. There is no evidence of a space-occupying lesion or intracranial hemorrhage. There is no evidence of a cortical-based area of acute infarction. There is generalized cerebral atrophy. There is periventricular white matter low attenuation likely secondary to microangiopathy. The ventricles and sulci are  appropriate for the patient's age. The basal cisterns are patent. Visualized portions of the orbits are unremarkable. The visualized portions of the paranasal sinuses and mastoid air cells are unremarkable. Cerebrovascular atherosclerotic calcifications are noted. The osseous structures are unremarkable. IMPRESSION: 1. No acute intracranial pathology. 2. Chronic microvascular disease and cerebral atrophy. Electronically Signed   By: Kathreen Devoid   On: 03/18/2015 12:05    EKG & Cardiac Imaging    EKG: Atrial fibrillation with HR of 71 and a RBBB. Was an incomplete RBBB on the tracing in 06/2014. Had atrial fibrillation at that time as well.  Echocardiogram: 07/05/2014 Study Conclusions  - Left ventricle: The cavity size was normal. Systolic function was normal. The estimated ejection fraction was in the range of 60% to 65%. - Aortic valve: There was trivial regurgitation. Valve area (Vmax): 2.01 cm^2. - Mitral valve: There was mild regurgitation. - Left atrium: The atrium was mildly dilated. - Right atrium: The atrium was mildly dilated.  Assessment & Plan    1. Paroxysmal Atrial Fibrillation - initial EKG this admission showed rate-controlled atrial fibrillation with a HR of 71. In reviewing his telemetry, he has remained in atrial fibrillation with a HR in the mid-40's - 70's. He was on Atenolol 50mg  daily PTA yet this has been held today due to bradycardia and hypotension. Agree with holding at this time. - in reviewing his medical records, his EKG from 06/2014 also showed atrial fibrillation with an incomplete RBBB which was obtained during an admission for "confusion". An MRI of the brain was obtained during that admission which showed no acute CVA.  - This patients CHA2DS2-VASc Score and unadjusted Ischemic Stroke Rate (% per year) is equal to 3.2 % stroke rate/year from a score of 3 (HTN, Age (2)). With his worsening dementia and frequent falls, I do not think he would be a good  candidate for anticoagulation at this time.  2. Altered Mental Status - noted to be "confused" by family members when he was found down on the ground prior to admission. - in reviewing prior records, likely has  baseline dementia as well. No family members are present at the time of this encounter to further assess what his baseline actually is.  - Only A&Ox1 (self) today, for he still believes it is Fall of 1985.  - in reviewing records, he is planning to return home at time of discharge and his son will be staying with him. PT is recommending SNF and I believe this would be a good option for the patient due to his declining mental status, generalized weakness, and increased falls.  3. Hyponatremia - Sodium 121 upon arrival. Improved to 127 on 03/19/2015 after receiving IV fluids - per admitting team   4. Left Kidney Mass - initially diagnosed in 11/2014. Followed by Dr. Pilar Jarvis - in reviewing his records, this is "renal cell carcinoma until proven otherwise". - not wishing to pursue invasive evaluation at this time.  5. Malnourishment/ Generalized Weakness - patient is frail-appearing on exam; was found down on the floor after a fall. - according to the patient, he mostly consumes Ensure and beer at home. Dietary consult has been placed by admitting team - PT is following the patient as well and recommended SNF at time of discharge.  6. Hypotension - BP has been 74/45 - 132/84 while admitted.  - was diagnosed with HTN prior to admission and was on Amlodipine 5mg  daily, Atenolol 50mg  daily, Fosinopril 20mg  daily, and HCTZ 12.5mg  daily. It is unclear if he was compliant on this regimen as an outpatient. - agree with discontinuing his Amlodipine, Fosinopril, and HCTZ in the setting of hypotension.   7. Leukocytosis - WBC elevated to 14.9 on admission. Improved to 9.4 today. - No evidence of a UTI or PNA.   Signed, Erma Heritage, PA-C 03/19/2015, 12:11 PM Pager: [phone]

## 2015-03-19 NOTE — Progress Notes (Signed)
[place] [facility] [place] NAME: Cory Robles    MR#:  [id number]  DATE OF BIRTH:  02/20/1929  SUBJECTIVE:   Pt. Here due to frequent falls and noted to be hyponatremia with AMS.  Still a bit confused but sodium improved.    REVIEW OF SYSTEMS:    Review of Systems  Unable to perform ROS: dementia    Nutrition: Regular Tolerating Diet: yes Tolerating PT: Await Eval.   DRUG ALLERGIES:  No Known Allergies  VITALS:  Blood pressure 91/41, pulse 71, temperature 97.7 F (36.5 C), temperature source Oral, resp. rate 20, height 6' (1.829 m), weight 65.8 kg (145 lb 1 oz), SpO2 99 %.  PHYSICAL EXAMINATION:   Physical Exam  GENERAL:  82 y.o.-year-old patient lying in the bed in no acute distress.  EYES: Pupils equal, round, reactive to light and accommodation. No scleral icterus. Extraocular muscles intact.  HEENT: Head atraumatic, normocephalic. Oropharynx and nasopharynx clear.  NECK:  Supple, no jugular venous distention. No thyroid enlargement, no tenderness.  LUNGS: Coarse breath sounds b/l. No wheezing. No use of accessory muscles of respiration.  CARDIOVASCULAR: S1, S2 normal. No murmurs, rubs, or gallops.  ABDOMEN: Soft, nontender, nondistended. Bowel sounds present. No organomegaly or mass.  EXTREMITIES: No cyanosis, clubbing or edema b/l.    NEUROLOGIC: Cranial nerves II through XII are intact. No focal Motor or sensory deficits b/l. Globally weak   PSYCHIATRIC: The patient is alert and oriented x 1.  SKIN: No obvious rash, lesion, or ulcer.    LABORATORY PANEL:   CBC  Recent Labs Lab 03/19/15 0628  WBC 9.4  HGB 12.6*  HCT 34.9*  PLT 126*   ------------------------------------------------------------------------------------------------------------------  Chemistries   Recent Labs Lab 03/18/15 1057  03/19/15 0628  NA 121*  --  127*  K 5.6*  --  4.7  CL 85*  --  96*  CO2 25  --  23  GLUCOSE 107*  --  86  BUN  52*  --  47*  CREATININE 1.24  < > 1.04  CALCIUM 9.8  --  8.9  AST 45*  --   --   ALT 29  --   --   ALKPHOS 89  --   --   BILITOT 3.6*  --   --   < > = values in this interval not displayed. ------------------------------------------------------------------------------------------------------------------  Cardiac Enzymes  Recent Labs Lab 03/18/15 1057  TROPONINI <0.03   ------------------------------------------------------------------------------------------------------------------  RADIOLOGY:  Dg Chest 2 View  03/18/2015  CLINICAL DATA:  Dementia, altered mental status, fall today EXAM: CHEST  2 VIEW COMPARISON:  07/04/2014 FINDINGS: Cardiomegaly is noted. Mild elevation of the left hemidiaphragm. No acute infiltrate or pulmonary edema. Osteopenia and mild degenerative changes thoracolumbar spine. There is mild compression deformity upper lumbar spine of indeterminate age. Clinical correlation is necessary. IMPRESSION: No acute infiltrate or pulmonary edema. Osteopenia and mild degenerative changes thoracolumbar spine. There is mild compression deformity upper lumbar spine of indeterminate age. Clinical correlation is necessary. Electronically Signed   By: Lahoma Crocker M.D.   On: 03/18/2015 11:41   Dg Pelvis 1-2 Views  03/18/2015  CLINICAL DATA:  Fall, left forearm skin tear EXAM: PELVIS - 1-2 VIEW COMPARISON:  None. FINDINGS: Atherosclerotic calcifications bilateral femoral artery. Bilateral hip joints are symmetrical in appearance. No acute fracture or subluxation. IMPRESSION: Negative. Electronically Signed   By: Lahoma Crocker M.D.   On: 03/18/2015 11:42   Ct Head Wo Contrast  03/18/2015  CLINICAL DATA:  Altered mental status.  Dementia. EXAM: CT HEAD WITHOUT CONTRAST TECHNIQUE: Contiguous axial images were obtained from the base of the skull through the vertex without intravenous contrast. COMPARISON:  CT brain 07/04/2014 FINDINGS: There is no evidence of mass effect, midline shift, or  extra-axial fluid collections. There is no evidence of a space-occupying lesion or intracranial hemorrhage. There is no evidence of a cortical-based area of acute infarction. There is generalized cerebral atrophy. There is periventricular white matter low attenuation likely secondary to microangiopathy. The ventricles and sulci are appropriate for the patient's age. The basal cisterns are patent. Visualized portions of the orbits are unremarkable. The visualized portions of the paranasal sinuses and mastoid air cells are unremarkable. Cerebrovascular atherosclerotic calcifications are noted. The osseous structures are unremarkable. IMPRESSION: 1. No acute intracranial pathology. 2. Chronic microvascular disease and cerebral atrophy. Electronically Signed   By: Kathreen Devoid   On: 03/18/2015 12:05     ASSESSMENT AND PLAN:   82 year old male with past medical history of dementia, paroxysmal atrial fibrillation, hypertension, previous history of renal mass who presents to the hospital due to altered mental status and noted to be hyponatremic.  #1 altered mental status-multifactorial with underlying dementia, continue with hyponatremia. -CT head on admission is negative. No acute infectious pathology. -Continue IV fluids, sodium is improving. Follow mental status.  #2 hyponatremia-likely hypovolemic hypotonic hyponatremia. -Continue gentle IV fluids. Sodium is improving.  #3 paroxysmal atrial fibrillation-this is chronic for the patient. He is currently rate controlled. Continue atenolol. -He is a high fall risk and therefore is not on long-term anticoagulation. -Appreciate cardiology consult and their input.  #4 BPH-continue Flomax.  #5 hypertension-blood pressure on the low side. Hold HCTZ, lisinopril, Norvasc. Continue atenolol.  #6 chronic recurrent falls-await physical therapy evaluation. Patient will likely need placement.   All the records are reviewed and case discussed with Care  Management/Social Workerr. Management plans discussed with the patient, family and they are in agreement.  CODE STATUS: DNR  DVT Prophylaxis: Lovenox  TOTAL TIME TAKING CARE OF THIS PATIENT: 30 minutes.   POSSIBLE D/C IN 1-2 DAYS, DEPENDING ON CLINICAL CONDITION.   Henreitta Leber M.D on 03/19/2015 at 2:12 PM  Between 7am to 6pm - Pager - [phone]  After 6pm go to [email or web address] - password [email or web address] [facility]  [place] [facility]  Office  [phone]  CC: Primary care physician; Linus Mako, NP

## 2015-03-19 NOTE — NC FL2 (Deleted)
[place] LEVEL OF CARE SCREENING TOOL     IDENTIFICATION  Patient Name: Cory Robles Birthdate: 08-06-1929 Sex: male Admission Date (Current Location): 03/18/2015  [place] and [place] Number:  Engineering geologist and Address:  [facility], [address] [place], [place] [address]      Provider Number: [id number]  Attending Physician Name and Address:  Henreitta Leber, MD  Relative Name and Phone Number:       Current Level of Care: Hospital Recommended Level of Care: [place] Prior Approval Number:    Date Approved/Denied:   PASRR Number:    Discharge Plan: SNF    Current Diagnoses: Patient Active Problem List   Diagnosis Date Noted  . PAF (paroxysmal atrial fibrillation) ([place]) 03/19/2015  . Hyponatremia 03/18/2015  . Erythrocytosis 10/30/2014  . Confusion 07/05/2014    Orientation RESPIRATION BLADDER Height & Weight     Self, Situation, Place  O2- 2 litres Continent Weight: 145 lb 1 oz (65.8 kg) Height:  6' (182.9 cm)  BEHAVIORAL SYMPTOMS/MOOD NEUROLOGICAL BOWEL NUTRITION STATUS  Other (Comment) (Early onset demetia)   Continent Diet (Normal -high calorie-no appetite)  AMBULATORY STATUS COMMUNICATION OF NEEDS Skin   Supervision/uses walker Verbally Normal                       Personal Care Assistance Level of Assistance  Bathing, Feeding, Dressing Bathing Assistance: Limited assistance Feeding assistance: Limited assistance Dressing Assistance: Limited assistance     Functional Limitations Info  Sight, Hearing, Speech Sight Info: Adequate Hearing Info: Impaired Speech Info: Adequate    SPECIAL CARE FACTORS FREQUENCY  PT (By licensed PT)     PT Frequency: 3x week ( TBD)              Contractures      Additional Factors Info                  Current Medications (03/19/2015):  This is the current hospital active medication list Current Facility-Administered Medications   Medication Dose Route Frequency Provider Last Rate Last Dose  . 0.9 %  sodium chloride infusion   Intravenous Continuous Henreitta Leber, MD      . acetaminophen (TYLENOL) tablet 650 mg  650 mg Oral Q6H PRN Bettey Costa, MD       Or  . acetaminophen (TYLENOL) suppository 650 mg  650 mg Rectal Q6H PRN Bettey Costa, MD      . alum & mag hydroxide-simeth (MAALOX/MYLANTA) 200-200-20 MG/5ML suspension 30 mL  30 mL Oral Q6H PRN Sital Mody, MD      . atenolol (TENORMIN) tablet 50 mg  50 mg Oral Daily Bettey Costa, MD   50 mg at 03/18/15 1633  . cholecalciferol (VITAMIN D) tablet 1,000 Units  1,000 Units Oral Daily Bettey Costa, MD   1,000 Units at 03/19/15 1003  . enoxaparin (LOVENOX) injection 40 mg  40 mg Subcutaneous Q24H Bettey Costa, MD   40 mg at 03/18/15 2236  . feeding supplement (ENSURE ENLIVE) (ENSURE ENLIVE) liquid 237 mL  237 mL Oral TID BM Henreitta Leber, MD      . HYDROcodone-acetaminophen (NORCO/VICODIN) 5-325 MG per tablet 1-2 tablet  1-2 tablet Oral Q4H PRN Bettey Costa, MD   2 tablet at 03/18/15 2241  . ondansetron (ZOFRAN) tablet 4 mg  4 mg Oral Q6H PRN Bettey Costa, MD       Or  . ondansetron (ZOFRAN) injection  4 mg  4 mg Intravenous Q6H PRN Bettey Costa, MD   4 mg at 03/18/15 2253  . senna-docusate (Senokot-S) tablet 1 tablet  1 tablet Oral QHS PRN Bettey Costa, MD      . tamsulosin (FLOMAX) capsule 0.4 mg  0.4 mg Oral QPC breakfast Bettey Costa, MD   0.4 mg at 03/19/15 1000     Discharge Medications: Please see discharge summary for a list of discharge medications.  Relevant Imaging Results:  Relevant Lab Results:   Additional Information [id number]  Joana Reamer, [place]

## 2015-03-19 NOTE — Care Management (Signed)
Admitted to [facility] with the diagnosis of hyponatremia. Lives alone, Daughters are Budd Palmer [phone]) and Chrystine Oiler [phone]). Dr. [name] is listed as primary care physician. Noted that Cory Robles (son) will be living with him at discharge.  [name] sleeping, unable to communicate at this time. Sodium level 121 on admission. Foley placed. Shelbie Ammons RN MSN CCM Care Management [phone]

## 2015-03-19 NOTE — Evaluation (Signed)
Physical Therapy Evaluation Patient Details Name: Cory Robles MRN: [id number] DOB: September 11, 1929 Today's Date: 03/19/2015   History of Present Illness  presented to ER secondary to AMS, fall in home environment (found down for unknown period of time by daughter); admitted for metabolic encephalopathy due to hyponatremia (NA currently 127)  Clinical Impression  Upon evaluation, patient alert and oriented to self, location only; generally confused with poor safety awareness an poor insight into deficits.  Bilat UE/LE strength and ROM grossly WFL for basic transfers and mobility; denies pain.  Multiple abrasions/scapes noted over UE/LEs from previous falls in home environment.  Currently requiring mod assist +1 for bed mobility and close sup for unsupported sitting balance. Mild orthostasis noted with transition to upright (see vitals), but patient asymptomatic.  Refuses OOB attempts or additional mobility efforts despite heavy encouragement from therapist.  Will continue to assess/progress as able. Would benefit from skilled PT to address above deficits and promote optimal return to PLOF; recommend transition to STR upon discharge from acute hospitalization.     Follow Up Recommendations SNF    Equipment Recommendations       Recommendations for Other Services       Precautions / Restrictions Precautions Precautions: Fall Restrictions Weight Bearing Restrictions: No      Mobility  Bed Mobility Overal bed mobility: Needs Assistance Bed Mobility: Supine to Sit     Supine to sit: Mod assist     General bed mobility comments: assist for movement initiation and truncal elevation; heavy encouragement required  Transfers                 General transfer comment: patient refused OOB attempts at this time  Ambulation/Gait             General Gait Details: patient refused OOB attempts at this time  Stairs            Wheelchair Mobility    Modified Rankin  (Stroke Patients Only)       Balance Overall balance assessment: Needs assistance Sitting-balance support: No upper extremity supported;Feet supported Sitting balance-Leahy Scale: Fair         Standing balance comment: patient refused OOB attempts at this time                             Pertinent Vitals/Pain Pain Assessment: No/denies pain    Home Living Family/patient expects to be discharged to:: Private residence Living Arrangements: Alone   Type of Home: Mobile home Home Access: Stairs to enter Entrance Stairs-Rails: Right Entrance Stairs-Number of Steps: 4 Home Layout: One level   Additional Comments: patient able to provide very limited information due to AMS    Prior Function           Comments: Patient unable to provide much information due to AMS; will verity with family as available     Hand Dominance        Extremity/Trunk Assessment   Upper Extremity Assessment: Overall WFL for tasks assessed           Lower Extremity Assessment: Generalized weakness (grossly at least 4-/5 throughout bilat LEs, able to actively mobilize throughout functional range of motion)         Communication   Communication: No difficulties  Cognition Arousal/Alertness: Awake/alert Behavior During Therapy: WFL for tasks assessed/performed Overall Cognitive Status: Difficult to assess (oriented to self, location; generally confused, unsafe and unaware of deficits)  General Comments      Exercises        Assessment/Plan    PT Assessment Patient needs continued PT services  PT Diagnosis Difficulty walking;Generalized weakness   PT Problem List Decreased strength;Decreased range of motion;Decreased activity tolerance;Decreased balance;Decreased mobility;Decreased coordination;Decreased knowledge of use of DME;Decreased safety awareness;Decreased knowledge of precautions;Decreased cognition;Cardiopulmonary status limiting  activity;Decreased skin integrity  PT Treatment Interventions Gait training;Stair training;DME instruction;Therapeutic activities;Functional mobility training;Therapeutic exercise;Balance training;Cognitive remediation;Patient/family education   PT Goals (Current goals can be found in the Care Plan section) Acute Rehab PT Goals PT Goal Formulation: Patient unable to participate in goal setting Time For Goal Achievement: 04/02/15 Potential to Achieve Goals: Fair    Frequency Min 2X/week   Barriers to discharge Decreased caregiver support      Co-evaluation               End of Session Equipment Utilized During Treatment: Oxygen Activity Tolerance: Patient limited by fatigue Patient left: in bed;with call bell/phone within reach;with bed alarm set Nurse Communication: Mobility status         Time: [id number] PT Time Calculation (min) (ACUTE ONLY): 26 min   Charges:   PT Evaluation $PT Eval Low Complexity: 1 Procedure     PT G Codes:        Najeeb Uptain H. Owens Shark, PT, DPT, NCS 03/19/2015, 12:06 PM [phone]

## 2015-03-19 NOTE — Plan of Care (Signed)
Problem: Safety: Goal: Ability to remain free from injury will improve Outcome: Progressing Bed Alarms on related to High Fall risk.  Problem: Nutrition: Goal: Adequate nutrition will be maintained Outcome: Progressing Ensure Supplement provided as ordered.

## 2015-03-19 NOTE — Progress Notes (Signed)
Initial Nutrition Assessment  DOCUMENTATION CODES:   Severe malnutrition in context of chronic illness  INTERVENTION:   Meals and Snacks: Cater to patient preferences Medical Food Supplement Therapy: will recommend increasing Ensure Enlive to po TID, each supplement provides 350 kcal and 20 grams of protein   NUTRITION DIAGNOSIS:   Malnutrition related to chronic illness as evidenced by energy intake < or equal to 75% for > or equal to 1 month, severe depletion of muscle mass, severe depletion of body fat.  GOAL:   Patient will meet greater than or equal to 90% of their needs  MONITOR:    (Energy Intake, Electrolyte and renal Profile, Anthropometrics, Digestive System,)  REASON FOR ASSESSMENT:   Consult Assessment of nutrition requirement/status  ASSESSMENT:   Pt admitted after a fall. Pt with hyponatremia as well. Per MD note pt with kidney mass diagnosed in October.  Past Medical History  Diagnosis Date  . Hypertension   . Erythrocytosis   . Renal mass 11/2014    Left  . PAF (paroxysmal atrial fibrillation) ([facility])     a. Noted on EKG in 06/2014. b. Again noted on EKG in 03/2015 and on telemetry.     Diet Order:  Diet regular Room service appropriate?: Yes; Fluid consistency:: Thin    Current Nutrition: Pt reports poor appetite.  Food/Nutrition-Related History: Pt reports 'living off Ensure' for months. Pt reports having peanut butter spoonfuls or bites of cookies at times but mostly drinks 1-2 Ensure per day. RD asked if pt ate breakfast to which pt replied he does not like breakfast and does not eat. Pt also reports he does not eat dinner either. Pt reports no difficulty chewing or swallowing.   Scheduled Medications:  . atenolol  50 mg Oral Daily  . cholecalciferol  1,000 Units Oral Daily  . enoxaparin (LOVENOX) injection  40 mg Subcutaneous Q24H  . feeding supplement (ENSURE ENLIVE)  237 mL Oral TID BM  . tamsulosin  0.4 mg Oral QPC breakfast     Continuous Medications:  . sodium chloride       Electrolyte/Renal Profile and Glucose Profile:   Recent Labs Lab 03/18/15 1057 03/18/15 1815 03/19/15 0628  NA 121*  --  127*  K 5.6*  --  4.7  CL 85*  --  96*  CO2 25  --  23  BUN 52*  --  47*  CREATININE 1.24 1.32* 1.04  CALCIUM 9.8  --  8.9  GLUCOSE 107*  --  86   Protein Profile:   Recent Labs Lab 03/18/15 1057  ALBUMIN 4.2    Gastrointestinal Profile: Last BM: 03/17/2015   Nutrition-Focused Physical Exam Findings: Nutrition-Focused physical exam completed. Findings are mild-severe fat depletion, moderate-severe muscle depletion, and no edema.     Weight Change: Pt weight fluctuates per [facility] weight encounters    Height:   Ht Readings from Last 1 Encounters:  03/18/15 6' (1.829 m)    Weight:   Wt Readings from Last 1 Encounters:  03/18/15 145 lb 1 oz (65.8 kg)   Wt Readings from Last 10 Encounters:  03/18/15 145 lb 1 oz (65.8 kg)  12/11/14 145 lb (65.772 kg)  11/27/14 149 lb 8 oz (67.813 kg)  10/30/14 148 lb 13 oz (67.5 kg)  07/06/14 136 lb 9.6 oz (61.961 kg)  07/05/14 155 lb (70.308 kg)    Ideal Body Weight:   80.9kg  BMI:  Body mass index is 19.67 kg/(m^2).  Estimated Nutritional Needs:   Kcal:  BEE:  1537kcals, TEE: (IF 1.1-1.3)(AF 1.2) 2025-2398kcals, using IBW of 80.9kg  Protein:  89-105g protein (1.1-1.3g/kg)  Fluid:  2023-2422mL of fluid (25-67mL/kg)  EDUCATION NEEDS:   No education needs identified at this time   [place], RD, LDN Pager [phone] Weekend/On-Call Pager [phone]

## 2015-03-20 DIAGNOSIS — I10 Essential (primary) hypertension: Secondary | ICD-10-CM | POA: Diagnosis not present

## 2015-03-20 DIAGNOSIS — E871 Hypo-osmolality and hyponatremia: Secondary | ICD-10-CM | POA: Diagnosis not present

## 2015-03-20 DIAGNOSIS — I482 Chronic atrial fibrillation: Secondary | ICD-10-CM | POA: Diagnosis not present

## 2015-03-20 DIAGNOSIS — M6281 Muscle weakness (generalized): Secondary | ICD-10-CM | POA: Diagnosis not present

## 2015-03-20 DIAGNOSIS — Z741 Need for assistance with personal care: Secondary | ICD-10-CM | POA: Diagnosis not present

## 2015-03-20 DIAGNOSIS — Z9181 History of falling: Secondary | ICD-10-CM | POA: Diagnosis not present

## 2015-03-20 DIAGNOSIS — R531 Weakness: Secondary | ICD-10-CM | POA: Diagnosis not present

## 2015-03-20 DIAGNOSIS — R4182 Altered mental status, unspecified: Secondary | ICD-10-CM | POA: Diagnosis not present

## 2015-03-20 DIAGNOSIS — I480 Paroxysmal atrial fibrillation: Secondary | ICD-10-CM | POA: Diagnosis not present

## 2015-03-20 LAB — BASIC METABOLIC PANEL
Anion gap: 5 (ref 5–15)
BUN: 38 mg/dL — AB (ref 6–20)
CHLORIDE: 100 mmol/L — AB (ref 101–111)
CO2: 25 mmol/L (ref 22–32)
Calcium: 9 mg/dL (ref 8.9–10.3)
Creatinine, Ser: 0.83 mg/dL (ref 0.61–1.24)
GFR calc Af Amer: >60 mL/min (ref >60)
GFR calc non Af Amer: >60 mL/min (ref >60)
GLUCOSE: 88 mg/dL (ref 65–99)
POTASSIUM: 4.5 mmol/L (ref 3.5–5.1)
Sodium: 130 mmol/L — ABNORMAL LOW (ref 135–145)

## 2015-03-20 MED ORDER — ATENOLOL 25 MG PO TABS: 12.5000 mg | ORAL_TABLET | Freq: Every day | ORAL | Status: DC

## 2015-03-20 MED ORDER — ENSURE ENLIVE PO LIQD: 237.0000 mL | Freq: Three times a day (TID) | ORAL | Status: AC

## 2015-03-20 NOTE — Discharge Summary (Signed)
[facility] [place] NAME: Cory Robles    MR#:  [id number]  DATE OF BIRTH:  06/27/1929  DATE OF ADMISSION:  03/18/2015 ADMITTING PHYSICIAN: Bettey Costa, MD  DATE OF DISCHARGE: No discharge date for patient encounter.  PRIMARY CARE PHYSICIAN: Linus Mako, NP    ADMISSION DIAGNOSIS:  Hyperkalemia [E87.5] Hyponatremia [E87.1] Weakness [R53.1] Fall, initial encounter [W19.XXXA]  DISCHARGE DIAGNOSIS:  Principal Problem:   Hyponatremia Active Problems:   Confusion   PAF (paroxysmal atrial fibrillation) ([facility])   Fall   Hyperkalemia   Weakness   Renal mass   Persistent atrial fibrillation ([facility])   Orthostatic hypotension   Protein-calorie malnutrition, severe   SECONDARY DIAGNOSIS:   Past Medical History  Diagnosis Date  . Hypertension   . Erythrocytosis   . Renal mass 11/2014    Left  . PAF (paroxysmal atrial fibrillation) ([place])     a. Noted on EKG in 06/2014. b. Again noted on EKG in 03/2015 and on telemetry.    HOSPITAL COURSE:   82 year old male with past medical history of dementia, paroxysmal atrial fibrillation, hypertension, previous history of renal mass who presents to the hospital due to altered mental status and noted to be hyponatremic.  #1 altered mental status-multifactorial with underlying dementia, complicated with hyponatremia. -CT head on admission is negative. There is No evidence of acute infectious pathology. -Patient is receiving some IV fluids and the sodium levels improved. Patient's mental status is not back to baseline.  #2 hyponatremia-likely hypovolemic hypotonic hyponatremia. -Resolved and normalized with IV fluids. he was on hydrochlorothiazide which has been discontinued.  #3 paroxysmal atrial fibrillation-this is chronic for the patient. He is currently rate controlled. He will Continue atenolol. -He is a high fall risk and therefore is not on long-term anticoagulation. -pt. Was  seen by cardiology and appreciate their input.    #4 BPH- patient will continue Flomax.  #5 hypertension- bp. Has been stable while in the hospital.  Pt. Will cont. Atenolol.  I have taken his off his Fosinopril, HCTZ given dehydration and electrolyte abnormalities while in the hospital.    #6 chronic recurrent falls-she was seen by physical therapy and recommended short-term rehabilitation which is where he is presently being discharged.  DISCHARGE CONDITIONS:   Stable  CONSULTS OBTAINED:  Treatment Team:  Minna Merritts, MD  DRUG ALLERGIES:  No Known Allergies  DISCHARGE MEDICATIONS:   Current Discharge Medication List    START taking these medications   Details  feeding supplement, ENSURE ENLIVE, (ENSURE ENLIVE) LIQD Take 237 mLs by mouth 3 (three) times daily between meals. Qty: 237 mL, Refills: 12      CONTINUE these medications which have NOT CHANGED   Details  amLODipine (NORVASC) 10 MG tablet Take 5 mg by mouth daily.    atenolol (TENORMIN) 50 MG tablet Take 50 mg by mouth daily.     Calcium Carbonate Antacid (ALKA-SELTZER ANTACID PO) Take 1 Dose by mouth daily as needed (for upset stomach).    cholecalciferol (VITAMIN D) 1000 UNITS tablet Take 1,000 Units by mouth daily.    tamsulosin (FLOMAX) 0.4 MG CAPS capsule Take 0.4 mg by mouth.    aspirin 81 MG chewable tablet Chew 1 tablet (81 mg total) by mouth daily. Qty: 30 tablet, Refills: 0      STOP taking these medications     fosinopril (MONOPRIL) 40 MG tablet      hydrochlorothiazide (HYDRODIURIL) 25 MG tablet  DISCHARGE INSTRUCTIONS:   DIET:  Regular diet  DISCHARGE CONDITION:  Stable  ACTIVITY:  Activity as tolerated  OXYGEN:  Home Oxygen: No.   Oxygen Delivery: room air  DISCHARGE LOCATION:  nursing home   If you experience worsening of your admission symptoms, develop shortness of breath, life threatening emergency, suicidal or homicidal thoughts you must seek medical  attention immediately by calling 911 or calling your MD immediately  if symptoms less severe.  You Must read complete instructions/literature along with all the possible adverse reactions/side effects for all the Medicines you take and that have been prescribed to you. Take any new Medicines after you have completely understood and accpet all the possible adverse reactions/side effects.   Please note  You were cared for by a hospitalist during your hospital stay. If you have any questions about your discharge medications or the care you received while you were in the hospital after you are discharged, you can call the unit and asked to speak with the hospitalist on call if the hospitalist that took care of you is not available. Once you are discharged, your primary care physician will handle any further medical issues. Please note that NO REFILLS for any discharge medications will be authorized once you are discharged, as it is imperative that you return to your primary care physician (or establish a relationship with a primary care physician if you do not have one) for your aftercare needs so that they can reassess your need for medications and monitor your lab values.     Today   Mental status stable.  Sodium normalized.  Drinking his ensure.  No other complaints.   VITAL SIGNS:  Blood pressure 90/62, pulse 73, temperature 98.2 F (36.8 C), temperature source Oral, resp. rate 20, height 6' (1.829 m), weight 65.8 kg (145 lb 1 oz), SpO2 97 %.  I/O:   Intake/Output Summary (Last 24 hours) at 03/20/15 1333 Last data filed at 03/20/15 1100  Gross per 24 hour  Intake 551.67 ml  Output    300 ml  Net 251.67 ml    PHYSICAL EXAMINATION:   GENERAL: 82 y.o.-year-old patient lying in the bed in no acute distress.  EYES: Pupils equal, round, reactive to light and accommodation. No scleral icterus. Extraocular muscles intact.  HEENT: Head atraumatic, normocephalic. Oropharynx and nasopharynx  clear.  NECK: Supple, no jugular venous distention. No thyroid enlargement, no tenderness.  LUNGS: Coarse breath sounds b/l. No wheezing. No use of accessory muscles of respiration.  CARDIOVASCULAR: S1, S2 normal. No murmurs, rubs, or gallops.  ABDOMEN: Soft, nontender, nondistended. Bowel sounds present. No organomegaly or mass.  EXTREMITIES: No cyanosis, clubbing or edema b/l.  NEUROLOGIC: Cranial nerves II through XII are intact. No focal Motor or sensory deficits b/l. Globally weak  PSYCHIATRIC: The patient is alert and oriented x 1.  SKIN: No obvious rash, lesion, or ulcer.   DATA REVIEW:   CBC  Recent Labs Lab 03/19/15 0628  WBC 9.4  HGB 12.6*  HCT 34.9*  PLT 126*    Chemistries   Recent Labs Lab 03/18/15 1057  03/20/15 0631  NA 121*  < > 130*  K 5.6*  < > 4.5  CL 85*  < > 100*  CO2 25  < > 25  GLUCOSE 107*  < > 88  BUN 52*  < > 38*  CREATININE 1.24  < > 0.83  CALCIUM 9.8  < > 9.0  AST 45*  --   --   ALT  29  --   --   ALKPHOS 89  --   --   BILITOT 3.6*  --   --   < > = values in this interval not displayed.  Cardiac Enzymes  Recent Labs Lab 03/18/15 1057  TROPONINI <0.03    RADIOLOGY:  No results found.    Management plans discussed with the patient, family and they are in agreement.  CODE STATUS:     Code Status Orders        Start     Ordered   03/18/15 1808  Do not attempt resuscitation (DNR)   Continuous    Question Answer Comment  In the event of cardiac or respiratory ARREST Do not call a "code blue"   In the event of cardiac or respiratory ARREST Do not perform Intubation, CPR, defibrillation or ACLS   In the event of cardiac or respiratory ARREST Use medication by any route, position, wound care, and other measures to relive pain and suffering. May use oxygen, suction and manual treatment of airway obstruction as needed for comfort.      03/18/15 1807    Code Status History    Date Active Date Inactive Code Status  Order ID Comments User Context   07/05/2014  5:32 AM 07/06/2014  4:06 PM Full Code [id number]  Harrie Foreman, MD ED      TOTAL TIME TAKING CARE OF THIS PATIENT: 40 minutes.    Henreitta Leber M.D on 03/20/2015 at 1:33 PM  Between 7am to 6pm - Pager - [phone]  After 6pm go to [email or web address] - password [email or web address] [facility]  [facility]  Office  [phone]  CC: Primary care physician; Linus Mako, NP

## 2015-03-20 NOTE — Progress Notes (Signed)
Spoke with Caryl Asp, [facility] rep at [phone], to notify of non-emergent EMS transport.  Auth notification reference given as [id number].   Service date range good from 03/20/15 - 06/17/15.   Gap exception requested to determine if services can be considered at an in-network level.

## 2015-03-20 NOTE — Progress Notes (Signed)
LCSW met with patient who selected [facility].  LCSW called [facility] and accepted the bed, spoke to [place].   Patients room number will be E-1`2 and call report #  to [place] [phone]  Patient will discuss being transported to [facility] by daughter.  Addalee Kavanagh LCSW

## 2015-03-20 NOTE — Progress Notes (Signed)
Hospital Problem List     Principal Problem:   Hyponatremia Active Problems:   Confusion   PAF (paroxysmal atrial fibrillation) (HCC)   Fall   Hyperkalemia   Weakness   Renal mass   Persistent atrial fibrillation (HCC)   Orthostatic hypotension   Protein-calorie malnutrition, severe    Patient Profile:   Primary Cardiologist: New to [facility] - Dr. Rockey Situ  82 y.o. male w/ PMH of HTN, a kidney mass (diagnosed in 11/2014 - no plans for surgical treatment), and worsening short-term memory who presented to [facility] on 03/18/2015 after being found on the floor by his daughter yesterday and noted to have increased confusion. Cardiology consulted for "new-onset" atrial fibrillation.  Subjective   Denies any chest pain or trouble breathing. Had his nasal cannula around his neck saying "I wear it as needed".  He is alert to person and place. However, believes it is 19.  Inpatient Medications    . atenolol  50 mg Oral Daily  . cholecalciferol  1,000 Units Oral Daily  . enoxaparin (LOVENOX) injection  40 mg Subcutaneous Q24H  . feeding supplement (ENSURE ENLIVE)  237 mL Oral TID BM  . tamsulosin  0.4 mg Oral QPC breakfast    Vital Signs    Filed Vitals:   03/19/15 1246 03/19/15 2137 03/20/15 0504 03/20/15 1000  BP: 91/41 107/55 91/52 90/62   Pulse: 71 81 65 73  Temp: 97.7 F (36.5 C) 97.6 F (36.4 C) 98.2 F (36.8 C)   TempSrc: Oral Oral Oral   Resp: 20 20 20    Height:      Weight:      SpO2:  97%      Intake/Output Summary (Last 24 hours) at 03/20/15 1042 Last data filed at 03/20/15 0800  Gross per 24 hour  Intake 551.67 ml  Output    100 ml  Net 451.67 ml   Filed Weights   03/18/15 1053  Weight: 145 lb 1 oz (65.8 kg)    Physical Exam    General: Pleasant, elderly, frail, Caucasian male appearing in NAD. Psych: Normal affect. Neuro: Alert and oriented X 2. Knows his full name and aware he is at [employer]. Thinks the year is 40. Moves all extremities  spontaneously. HEENT: Normal Neck: Supple without bruits or JVD. Lungs: Resp regular and unlabored, Decreased breath sounds at bases bilaterally. No wheezing or rales appreciated. Heart: Irregularly irregular, no s3, s4, or murmurs. Abdomen: Soft, non-tender, non-distended, BS + x 4.  Extremities: No clubbing, cyanosis or edema. DP/PT/Radials 2+ and equal bilaterally. Left arm with skin tear.  Labs    CBC  Recent Labs  03/18/15 1057 03/18/15 1815 03/19/15 0628  WBC 14.9* 12.9* 9.4  NEUTROABS 12.6*  --   --   HGB 14.7 12.8* 12.6*  HCT 41.7 36.0* 34.9*  MCV 94.5 95.5 94.3  PLT 155 153 [id number]*   Basic Metabolic Panel  Recent Labs  03/19/15 0628 03/20/15 0631  NA 127* 130*  K 4.7 4.5  CL 96* 100*  CO2 23 25  GLUCOSE 86 88  BUN 47* 38*  CREATININE 1.04 0.83  CALCIUM 8.9 9.0   Liver Function Tests  Recent Labs  03/18/15 1057  AST 45*  ALT 29  ALKPHOS 89  BILITOT 3.6*  PROT 7.5  ALBUMIN 4.2   Cardiac Enzymes  Recent Labs  03/18/15 1057  CKTOTAL 39  TROPONINI <0.03   Thyroid Function Tests  Recent Labs  03/18/15 1815  TSH 0.640  Telemetry   Atrial fibrillation with HR in there 60's - 70's. No atopic events.  ECG    No new tracings.   Cardiac Studies and Radiology    Dg Chest 2 View: 03/18/2015  CLINICAL DATA:  Dementia, altered mental status, fall today EXAM: CHEST  2 VIEW COMPARISON:  07/04/2014 FINDINGS: Cardiomegaly is noted. Mild elevation of the left hemidiaphragm. No acute infiltrate or pulmonary edema. Osteopenia and mild degenerative changes thoracolumbar spine. There is mild compression deformity upper lumbar spine of indeterminate age. Clinical correlation is necessary. IMPRESSION: No acute infiltrate or pulmonary edema. Osteopenia and mild degenerative changes thoracolumbar spine. There is mild compression deformity upper lumbar spine of indeterminate age. Clinical correlation is necessary. Electronically Signed   By: Lahoma Crocker  M.D.   On: 03/18/2015 11:41   Dg Pelvis 1-2 Views: 03/18/2015  CLINICAL DATA:  Fall, left forearm skin tear EXAM: PELVIS - 1-2 VIEW COMPARISON:  None. FINDINGS: Atherosclerotic calcifications bilateral femoral artery. Bilateral hip joints are symmetrical in appearance. No acute fracture or subluxation. IMPRESSION: Negative. Electronically Signed   By: Lahoma Crocker M.D.   On: 03/18/2015 11:42   Ct Head Wo Contrast: 03/18/2015  CLINICAL DATA:  Altered mental status.  Dementia. EXAM: CT HEAD WITHOUT CONTRAST TECHNIQUE: Contiguous axial images were obtained from the base of the skull through the vertex without intravenous contrast. COMPARISON:  CT brain 07/04/2014 FINDINGS: There is no evidence of mass effect, midline shift, or extra-axial fluid collections. There is no evidence of a space-occupying lesion or intracranial hemorrhage. There is no evidence of a cortical-based area of acute infarction. There is generalized cerebral atrophy. There is periventricular white matter low attenuation likely secondary to microangiopathy. The ventricles and sulci are appropriate for the patient's age. The basal cisterns are patent. Visualized portions of the orbits are unremarkable. The visualized portions of the paranasal sinuses and mastoid air cells are unremarkable. Cerebrovascular atherosclerotic calcifications are noted. The osseous structures are unremarkable. IMPRESSION: 1. No acute intracranial pathology. 2. Chronic microvascular disease and cerebral atrophy. Electronically Signed   By: Kathreen Devoid   On: 03/18/2015 12:05    Echocardiogram: 03/18/2015 Study Conclusions - Left ventricle: The cavity size was normal. Systolic function was normal. The estimated ejection fraction was in the range of 55% to 60%. Wall motion was normal; there were no regional wall motion abnormalities. - Mitral valve: There was moderate regurgitation. - Tricuspid valve: There was moderate regurgitation.  Impressions: - Normal  study.   Assessment & Plan    1. Paroxysmal Atrial Fibrillation - initial EKG this admission showed rate-controlled atrial fibrillation with a HR of 71. In reviewing his telemetry, he has remained in atrial fibrillation with a HR in the 60's - 70's over the past 24 hours. He was on Atenolol 50mg  daily PTA yet this has been held today due to bradycardia and hypotension. Agree with holding at this time. Will decrease dose to 12.5mg  daily with holding parameters for SBP and HR. - in reviewing his medical records, his EKG from 06/2014 also showed atrial fibrillation with an incomplete RBBB which was obtained during an admission for "confusion". An MRI of the brain was obtained during that admission which showed no acute CVA.  - This patients CHA2DS2-VASc Score and unadjusted Ischemic Stroke Rate (% per year) is equal to 3.2 % stroke rate/year from a score of 3 (HTN, Age (2)). With his worsening dementia and frequent falls, I do not think he would be a good candidate for anticoagulation at  this time.  2. Altered Mental Status - noted to be "confused" by family members when he was found down on the ground prior to admission. - in reviewing prior records, likely has baseline dementia as well. No family members are present at the time of this encounter to further assess what his baseline actually is.  - Only A&Ox1 (self) today, for he still believes it is Fall of 1985.  - in reviewing records, it appears he will be going to SNF at the time of discharge. His family accepted a bed at [facility].  3. Hyponatremia - Sodium 121 upon arrival. Improved to 130 on 03/20/2015 after receiving IV fluids - per admitting team  4. Left Kidney Mass - initially diagnosed in 11/2014. Followed by Dr. Pilar Jarvis - in reviewing his records, this is "renal cell carcinoma until proven otherwise". - not wishing to pursue invasive evaluation at this time.  5. Malnourishment/ Generalized Weakness -  patient is frail-appearing on exam; was found down on the floor after a fall. - according to the patient, he mostly consumes Ensure and beer at home. Dietary consult has been placed by admitting team - PT is following the patient as well and recommended SNF.  6. Hypotension - BP has been 90/41 - 107/62 in the past 24 hours. Improved from yesterday. - was diagnosed with HTN prior to admission and was on Amlodipine 5mg  daily, Atenolol 50mg  daily, Fosinopril 20mg  daily, and HCTZ 12.5mg  daily. It is unclear if he was compliant on this regimen as an outpatient. - agree with discontinuing his Amlodipine, Fosinopril, and HCTZ in the setting of hypotension.   7. Leukocytosis - WBC elevated to 14.9 on admission. Improved to 9.4 on 03/19/2015. - No evidence of a UTI or PNA.   Signed, Erma Heritage , PA-C 10:42 AM 03/20/2015 Pager: [phone]

## 2015-03-20 NOTE — Care Management Important Message (Signed)
Important Message  Patient Details  Name: Cory Robles MRN: [id number] Date of Birth: 1929/11/18   Medicare Important Message Given:  Yes    [name] 03/20/2015, 10:50 AM

## 2015-03-20 NOTE — Clinical Social Work Note (Signed)
Pt is ready for discharge today to [facility]. Facility is ready accept pt as they have received discharge information. Pt's duaghter (POA) is aware of transfer and agreeable to discharge plan. RN called report and EMS provide transportation. CSW is signing off as no further needs identified.   Darden Dates, MSW, LCSW  Clinical Social Worker  [phone]

## 2015-03-20 NOTE — Progress Notes (Signed)
Patient discharged to [facility]. Report given to [facility] at facility. EMS called for transport.

## 2015-03-25 DIAGNOSIS — I482 Chronic atrial fibrillation: Secondary | ICD-10-CM | POA: Diagnosis not present

## 2015-04-14 ENCOUNTER — Other Ambulatory Visit: Payer: Self-pay | Admitting: *Deleted

## 2015-04-14 DIAGNOSIS — D751 Secondary polycythemia: Secondary | ICD-10-CM

## 2015-04-16 ENCOUNTER — Inpatient Hospital Stay (HOSPITAL_BASED_OUTPATIENT_CLINIC_OR_DEPARTMENT_OTHER): Payer: Medicare Other | Admitting: Family Medicine

## 2015-04-16 ENCOUNTER — Encounter: Payer: Self-pay | Admitting: Family Medicine

## 2015-04-16 ENCOUNTER — Inpatient Hospital Stay: Payer: Medicare Other

## 2015-04-16 ENCOUNTER — Inpatient Hospital Stay: Payer: Medicare Other | Attending: Family Medicine

## 2015-04-16 VITALS — BP 96/61 | HR 101 | Temp 95.7°F | Ht 69.0 in | Wt 127.9 lb

## 2015-04-16 DIAGNOSIS — Z7982 Long term (current) use of aspirin: Secondary | ICD-10-CM

## 2015-04-16 DIAGNOSIS — I480 Paroxysmal atrial fibrillation: Secondary | ICD-10-CM | POA: Insufficient documentation

## 2015-04-16 DIAGNOSIS — I10 Essential (primary) hypertension: Secondary | ICD-10-CM | POA: Diagnosis not present

## 2015-04-16 DIAGNOSIS — R531 Weakness: Secondary | ICD-10-CM

## 2015-04-16 DIAGNOSIS — N2889 Other specified disorders of kidney and ureter: Secondary | ICD-10-CM

## 2015-04-16 DIAGNOSIS — D45 Polycythemia vera: Secondary | ICD-10-CM | POA: Diagnosis not present

## 2015-04-16 DIAGNOSIS — D751 Secondary polycythemia: Secondary | ICD-10-CM

## 2015-04-16 DIAGNOSIS — R5383 Other fatigue: Secondary | ICD-10-CM

## 2015-04-16 DIAGNOSIS — Z87891 Personal history of nicotine dependence: Secondary | ICD-10-CM

## 2015-04-16 DIAGNOSIS — Z79899 Other long term (current) drug therapy: Secondary | ICD-10-CM | POA: Diagnosis not present

## 2015-04-16 HISTORY — DX: Polycythemia vera: D45

## 2015-04-16 LAB — CBC WITH DIFFERENTIAL/PLATELET
BASOS PCT: 1 %
Basophils Absolute: 0.1 10*3/uL (ref 0–0.1)
EOS ABS: 0.1 10*3/uL (ref 0–0.7)
Eosinophils Relative: 1 %
HCT: 37.1 % — ABNORMAL LOW (ref 40.0–52.0)
HEMOGLOBIN: 12.9 g/dL — AB (ref 13.0–18.0)
LYMPHS ABS: 1.1 10*3/uL (ref 1.0–3.6)
Lymphocytes Relative: 10 %
MCH: 33.8 pg (ref 26.0–34.0)
MCHC: 34.8 g/dL (ref 32.0–36.0)
MCV: 97.1 fL (ref 80.0–100.0)
MONO ABS: 1.4 10*3/uL — AB (ref 0.2–1.0)
MONOS PCT: 14 %
NEUTROS PCT: 74 %
Neutro Abs: 7.8 10*3/uL — ABNORMAL HIGH (ref 1.4–6.5)
Platelets: 184 10*3/uL (ref 150–440)
RBC: 3.82 MIL/uL — ABNORMAL LOW (ref 4.40–5.90)
RDW: 16.2 % — AB (ref 11.5–14.5)
WBC: 10.5 10*3/uL (ref 3.8–10.6)

## 2015-04-16 NOTE — Progress Notes (Signed)
Cory Robles  [phone]  [phone]     Cory Robles DOB: 12-Sep-1929  MR#: [id number]  [id number]  Patient Care Team: Linus Mako, NP as PCP - General (Nurse Practitioner) Minna Merritts, MD as Consulting Physician (Cardiology)  CHIEF COMPLAINT:  Chief Complaint  Patient presents with  . Follow-up    Pt is here for polycythemia follow up .     INTERVAL HISTORY:  Patient returns for continued hematology followup for history of polycythemia, he was last seen in September 2016 with Dr. Ma Hillock. States he is doing steady, chronic fatigue on exertion is same. He has a diagnosed renal cancer through the [place] and his PCP. Decision was made to not proceed with any aggressive treatment per son in law. Denies any thromboembolic problems like deep venous thrombosis, pulmonary embolism, TIA, stroke or angina since last visit here. No new headaches or facial flushing. Appetite is good. Overall he feels very well and denies any complaints.   REVIEW OF SYSTEMS:   Review of Systems  Constitutional: Positive for malaise/fatigue. Negative for fever, chills, weight loss and diaphoresis.  HENT: Negative.   Eyes: Negative.   Respiratory: Negative for cough, hemoptysis, sputum production, shortness of breath and wheezing.   Cardiovascular: Negative for chest pain, palpitations, orthopnea, claudication, leg swelling and PND.  Gastrointestinal: Negative for heartburn, nausea, vomiting, abdominal pain, diarrhea, constipation, blood in stool and melena.  Genitourinary: Negative.   Musculoskeletal: Negative.   Skin: Negative.   Neurological: Negative for dizziness, tingling, focal weakness, seizures and weakness.  Endo/Heme/Allergies: Does not bruise/bleed easily.  Psychiatric/Behavioral: Negative for depression. The patient is not nervous/anxious and does not have insomnia.     As per HPI. Otherwise, a complete review of systems is negatve.   PAST MEDICAL  HISTORY: Past Medical History  Diagnosis Date  . Hypertension   . Erythrocytosis   . Renal mass 11/2014    Left  . PAF (paroxysmal atrial fibrillation) ([place])     a. Noted on EKG in 06/2014. b. Again noted on EKG in 03/2015 and on telemetry.  . Polycythemia vera ([place]) 04/16/2015    PAST SURGICAL HISTORY: No past surgical history on file.  FAMILY HISTORY Family History  Problem Relation Age of Onset  . Hypertension Father   . Urolithiasis Neg Hx   . Kidney disease Neg Hx   . Kidney cancer Neg Hx   . Prostate cancer Neg Hx     GYNECOLOGIC HISTORY:  No LMP for male patient.     ADVANCED DIRECTIVES:    HEALTH MAINTENANCE: Social History  Substance Use Topics  . Smoking status: Former Research scientist (life sciences)  . Smokeless tobacco: Current User    Types: Chew  . Alcohol Use: 8.4 oz/week    14 Cans of beer per week     Comment: 1-2 beers a day    No Known Allergies  Current Outpatient Prescriptions  Medication Sig Dispense Refill  . amLODipine (NORVASC) 10 MG tablet Take 5 mg by mouth daily.    Marland Kitchen aspirin 81 MG chewable tablet Chew 1 tablet (81 mg total) by mouth daily. 30 tablet 0  . atenolol (TENORMIN) 50 MG tablet Take 50 mg by mouth daily.     . Calcium Carbonate Antacid (ALKA-SELTZER ANTACID PO) Take 1 Dose by mouth daily as needed (for upset stomach).    . cholecalciferol (VITAMIN D) 1000 UNITS tablet Take 1,000 Units by mouth daily.    . feeding supplement, ENSURE ENLIVE, (ENSURE ENLIVE) LIQD  Take 237 mLs by mouth 3 (three) times daily between meals. 237 mL 12  . tamsulosin (FLOMAX) 0.4 MG CAPS capsule Take 0.4 mg by mouth.     No current facility-administered medications for this visit.    OBJECTIVE: BP 96/61 mmHg  Pulse 101  Temp(Src) 95.7 F (35.4 C) (Tympanic)  Ht 5\' 9"  (1.753 m)  Wt 127 lb 13.9 oz (58 kg)  BMI 18.87 kg/m2   Body mass index is 18.87 kg/(m^2).    ECOG FS:1 - Symptomatic but completely ambulatory  General: Well-developed, well-nourished, no acute  distress. Eyes: Pink conjunctiva, anicteric sclera. HEENT: Normocephalic, moist mucous membranes, clear oropharnyx. Lungs: Clear to auscultation bilaterally. Heart: Regular rate and rhythm. No rubs, murmurs, or gallops. Musculoskeletal: No edema, cyanosis, or clubbing. Neuro: Alert, answering all questions appropriately. Cranial nerves grossly intact. Skin: No rashes or petechiae noted. Psych: Normal affect.   LAB RESULTS:  Appointment on 04/16/2015  Component Date Value Ref Range Status  . WBC 04/16/2015 10.5  3.8 - 10.6 K/uL Final  . RBC 04/16/2015 3.82* 4.40 - 5.90 MIL/uL Final  . Hemoglobin 04/16/2015 12.9* 13.0 - 18.0 g/dL Final  . HCT 04/16/2015 37.1* 40.0 - 52.0 % Final  . MCV 04/16/2015 97.1  80.0 - 100.0 fL Final  . MCH 04/16/2015 33.8  26.0 - 34.0 pg Final  . MCHC 04/16/2015 34.8  32.0 - 36.0 g/dL Final  . RDW 04/16/2015 16.2* 11.5 - 14.5 % Final  . Platelets 04/16/2015 184  150 - 440 K/uL Final  . Neutrophils Relative % 04/16/2015 74   Final  . Neutro Abs 04/16/2015 7.8* 1.4 - 6.5 K/uL Final  . Lymphocytes Relative 04/16/2015 10   Final  . Lymphs Abs 04/16/2015 1.1  1.0 - 3.6 K/uL Final  . Monocytes Relative 04/16/2015 14   Final  . Monocytes Absolute 04/16/2015 1.4* 0.2 - 1.0 K/uL Final  . Eosinophils Relative 04/16/2015 1   Final  . Eosinophils Absolute 04/16/2015 0.1  0 - 0.7 K/uL Final  . Basophils Relative 04/16/2015 1   Final  . Basophils Absolute 04/16/2015 0.1  0 - 0.1 K/uL Final    STUDIES: No results found.  ASSESSMENT:  Polycythemia Vera.  Left renal mass.  PLAN:   1. PV. Patient continues to do well clinically, no h/o thromboembolic problems. Hct has been under good control. Hct is 37.1 today and he does not require phlebotomy today. Plan is to continue monitoring hematocrit once every 8 weeks and do phlebotomy 350 mL if it is 46 or higher.  2. Left renal mass. Patient has been following with Dr. Pilar Jarvis with [facility]. He has previously  turned down any interventions for treatment. He has an [place] scheduled for April 2017.   Will continue with routine evaluation of HCT in 3 months. He will return to see provider again in 6 months.  Advised patient and son in law to keep [place] updated regarding decisions involving renal mass and possible treatments.   Patient expressed understanding and was in agreement with this plan. He also understands that He can call clinic at any time with any questions, concerns, or complaints.   Dr. Oliva Bustard was available for consultation and review of plan of care for this patient.  Evlyn Kanner, NP   04/16/2015 1:22 PM

## 2015-04-17 DIAGNOSIS — I480 Paroxysmal atrial fibrillation: Secondary | ICD-10-CM | POA: Diagnosis not present

## 2015-04-17 DIAGNOSIS — Z9181 History of falling: Secondary | ICD-10-CM | POA: Diagnosis not present

## 2015-04-17 DIAGNOSIS — R2689 Other abnormalities of gait and mobility: Secondary | ICD-10-CM | POA: Diagnosis not present

## 2015-04-17 DIAGNOSIS — I10 Essential (primary) hypertension: Secondary | ICD-10-CM | POA: Diagnosis not present

## 2015-04-17 DIAGNOSIS — N400 Enlarged prostate without lower urinary tract symptoms: Secondary | ICD-10-CM | POA: Diagnosis not present

## 2015-04-17 DIAGNOSIS — M6281 Muscle weakness (generalized): Secondary | ICD-10-CM | POA: Diagnosis not present

## 2015-04-17 DIAGNOSIS — E349 Endocrine disorder, unspecified: Secondary | ICD-10-CM | POA: Diagnosis not present

## 2015-04-17 DIAGNOSIS — I951 Orthostatic hypotension: Secondary | ICD-10-CM | POA: Diagnosis not present

## 2015-04-20 DIAGNOSIS — Z9181 History of falling: Secondary | ICD-10-CM | POA: Diagnosis not present

## 2015-04-20 DIAGNOSIS — R2689 Other abnormalities of gait and mobility: Secondary | ICD-10-CM | POA: Diagnosis not present

## 2015-04-20 DIAGNOSIS — M6281 Muscle weakness (generalized): Secondary | ICD-10-CM | POA: Diagnosis not present

## 2015-04-20 DIAGNOSIS — E349 Endocrine disorder, unspecified: Secondary | ICD-10-CM | POA: Diagnosis not present

## 2015-04-20 DIAGNOSIS — I951 Orthostatic hypotension: Secondary | ICD-10-CM | POA: Diagnosis not present

## 2015-04-20 DIAGNOSIS — I480 Paroxysmal atrial fibrillation: Secondary | ICD-10-CM | POA: Diagnosis not present

## 2015-04-20 DIAGNOSIS — N400 Enlarged prostate without lower urinary tract symptoms: Secondary | ICD-10-CM | POA: Diagnosis not present

## 2015-04-20 DIAGNOSIS — I10 Essential (primary) hypertension: Secondary | ICD-10-CM | POA: Diagnosis not present

## 2015-04-22 DIAGNOSIS — E349 Endocrine disorder, unspecified: Secondary | ICD-10-CM | POA: Diagnosis not present

## 2015-04-22 DIAGNOSIS — Z9181 History of falling: Secondary | ICD-10-CM | POA: Diagnosis not present

## 2015-04-22 DIAGNOSIS — M6281 Muscle weakness (generalized): Secondary | ICD-10-CM | POA: Diagnosis not present

## 2015-04-22 DIAGNOSIS — I480 Paroxysmal atrial fibrillation: Secondary | ICD-10-CM | POA: Diagnosis not present

## 2015-04-22 DIAGNOSIS — I10 Essential (primary) hypertension: Secondary | ICD-10-CM | POA: Diagnosis not present

## 2015-04-22 DIAGNOSIS — N400 Enlarged prostate without lower urinary tract symptoms: Secondary | ICD-10-CM | POA: Diagnosis not present

## 2015-04-22 DIAGNOSIS — R2689 Other abnormalities of gait and mobility: Secondary | ICD-10-CM | POA: Diagnosis not present

## 2015-04-22 DIAGNOSIS — I951 Orthostatic hypotension: Secondary | ICD-10-CM | POA: Diagnosis not present

## 2015-04-24 DIAGNOSIS — Z9181 History of falling: Secondary | ICD-10-CM | POA: Diagnosis not present

## 2015-04-24 DIAGNOSIS — I10 Essential (primary) hypertension: Secondary | ICD-10-CM | POA: Diagnosis not present

## 2015-04-24 DIAGNOSIS — I480 Paroxysmal atrial fibrillation: Secondary | ICD-10-CM | POA: Diagnosis not present

## 2015-04-24 DIAGNOSIS — E349 Endocrine disorder, unspecified: Secondary | ICD-10-CM | POA: Diagnosis not present

## 2015-04-24 DIAGNOSIS — N400 Enlarged prostate without lower urinary tract symptoms: Secondary | ICD-10-CM | POA: Diagnosis not present

## 2015-04-24 DIAGNOSIS — M6281 Muscle weakness (generalized): Secondary | ICD-10-CM | POA: Diagnosis not present

## 2015-04-24 DIAGNOSIS — R2689 Other abnormalities of gait and mobility: Secondary | ICD-10-CM | POA: Diagnosis not present

## 2015-04-24 DIAGNOSIS — I951 Orthostatic hypotension: Secondary | ICD-10-CM | POA: Diagnosis not present

## 2015-04-27 DIAGNOSIS — M6281 Muscle weakness (generalized): Secondary | ICD-10-CM | POA: Diagnosis not present

## 2015-04-27 DIAGNOSIS — I951 Orthostatic hypotension: Secondary | ICD-10-CM | POA: Diagnosis not present

## 2015-04-27 DIAGNOSIS — Z9181 History of falling: Secondary | ICD-10-CM | POA: Diagnosis not present

## 2015-04-27 DIAGNOSIS — E349 Endocrine disorder, unspecified: Secondary | ICD-10-CM | POA: Diagnosis not present

## 2015-04-27 DIAGNOSIS — I480 Paroxysmal atrial fibrillation: Secondary | ICD-10-CM | POA: Diagnosis not present

## 2015-04-27 DIAGNOSIS — I10 Essential (primary) hypertension: Secondary | ICD-10-CM | POA: Diagnosis not present

## 2015-04-27 DIAGNOSIS — R2689 Other abnormalities of gait and mobility: Secondary | ICD-10-CM | POA: Diagnosis not present

## 2015-04-27 DIAGNOSIS — N400 Enlarged prostate without lower urinary tract symptoms: Secondary | ICD-10-CM | POA: Diagnosis not present

## 2015-04-29 DIAGNOSIS — M6281 Muscle weakness (generalized): Secondary | ICD-10-CM | POA: Diagnosis not present

## 2015-04-29 DIAGNOSIS — E349 Endocrine disorder, unspecified: Secondary | ICD-10-CM | POA: Diagnosis not present

## 2015-04-29 DIAGNOSIS — I10 Essential (primary) hypertension: Secondary | ICD-10-CM | POA: Diagnosis not present

## 2015-04-29 DIAGNOSIS — N400 Enlarged prostate without lower urinary tract symptoms: Secondary | ICD-10-CM | POA: Diagnosis not present

## 2015-04-29 DIAGNOSIS — Z9181 History of falling: Secondary | ICD-10-CM | POA: Diagnosis not present

## 2015-04-29 DIAGNOSIS — I951 Orthostatic hypotension: Secondary | ICD-10-CM | POA: Diagnosis not present

## 2015-04-29 DIAGNOSIS — I480 Paroxysmal atrial fibrillation: Secondary | ICD-10-CM | POA: Diagnosis not present

## 2015-04-29 DIAGNOSIS — R2689 Other abnormalities of gait and mobility: Secondary | ICD-10-CM | POA: Diagnosis not present

## 2015-05-01 DIAGNOSIS — Z9181 History of falling: Secondary | ICD-10-CM | POA: Diagnosis not present

## 2015-05-01 DIAGNOSIS — N400 Enlarged prostate without lower urinary tract symptoms: Secondary | ICD-10-CM | POA: Diagnosis not present

## 2015-05-01 DIAGNOSIS — I951 Orthostatic hypotension: Secondary | ICD-10-CM | POA: Diagnosis not present

## 2015-05-01 DIAGNOSIS — I10 Essential (primary) hypertension: Secondary | ICD-10-CM | POA: Diagnosis not present

## 2015-05-01 DIAGNOSIS — E349 Endocrine disorder, unspecified: Secondary | ICD-10-CM | POA: Diagnosis not present

## 2015-05-01 DIAGNOSIS — R2689 Other abnormalities of gait and mobility: Secondary | ICD-10-CM | POA: Diagnosis not present

## 2015-05-01 DIAGNOSIS — M6281 Muscle weakness (generalized): Secondary | ICD-10-CM | POA: Diagnosis not present

## 2015-05-01 DIAGNOSIS — I480 Paroxysmal atrial fibrillation: Secondary | ICD-10-CM | POA: Diagnosis not present

## 2015-05-05 DIAGNOSIS — I480 Paroxysmal atrial fibrillation: Secondary | ICD-10-CM | POA: Diagnosis not present

## 2015-05-05 DIAGNOSIS — R2689 Other abnormalities of gait and mobility: Secondary | ICD-10-CM | POA: Diagnosis not present

## 2015-05-05 DIAGNOSIS — E349 Endocrine disorder, unspecified: Secondary | ICD-10-CM | POA: Diagnosis not present

## 2015-05-05 DIAGNOSIS — Z9181 History of falling: Secondary | ICD-10-CM | POA: Diagnosis not present

## 2015-05-05 DIAGNOSIS — N400 Enlarged prostate without lower urinary tract symptoms: Secondary | ICD-10-CM | POA: Diagnosis not present

## 2015-05-05 DIAGNOSIS — I10 Essential (primary) hypertension: Secondary | ICD-10-CM | POA: Diagnosis not present

## 2015-05-05 DIAGNOSIS — M6281 Muscle weakness (generalized): Secondary | ICD-10-CM | POA: Diagnosis not present

## 2015-05-05 DIAGNOSIS — I951 Orthostatic hypotension: Secondary | ICD-10-CM | POA: Diagnosis not present

## 2015-05-07 DIAGNOSIS — Z9181 History of falling: Secondary | ICD-10-CM | POA: Diagnosis not present

## 2015-05-07 DIAGNOSIS — R2689 Other abnormalities of gait and mobility: Secondary | ICD-10-CM | POA: Diagnosis not present

## 2015-05-07 DIAGNOSIS — N400 Enlarged prostate without lower urinary tract symptoms: Secondary | ICD-10-CM | POA: Diagnosis not present

## 2015-05-07 DIAGNOSIS — M6281 Muscle weakness (generalized): Secondary | ICD-10-CM | POA: Diagnosis not present

## 2015-05-07 DIAGNOSIS — I951 Orthostatic hypotension: Secondary | ICD-10-CM | POA: Diagnosis not present

## 2015-05-07 DIAGNOSIS — I480 Paroxysmal atrial fibrillation: Secondary | ICD-10-CM | POA: Diagnosis not present

## 2015-05-07 DIAGNOSIS — I10 Essential (primary) hypertension: Secondary | ICD-10-CM | POA: Diagnosis not present

## 2015-05-07 DIAGNOSIS — E349 Endocrine disorder, unspecified: Secondary | ICD-10-CM | POA: Diagnosis not present

## 2015-05-12 DIAGNOSIS — E349 Endocrine disorder, unspecified: Secondary | ICD-10-CM | POA: Diagnosis not present

## 2015-05-12 DIAGNOSIS — I10 Essential (primary) hypertension: Secondary | ICD-10-CM | POA: Diagnosis not present

## 2015-05-12 DIAGNOSIS — R2689 Other abnormalities of gait and mobility: Secondary | ICD-10-CM | POA: Diagnosis not present

## 2015-05-12 DIAGNOSIS — N400 Enlarged prostate without lower urinary tract symptoms: Secondary | ICD-10-CM | POA: Diagnosis not present

## 2015-05-12 DIAGNOSIS — I480 Paroxysmal atrial fibrillation: Secondary | ICD-10-CM | POA: Diagnosis not present

## 2015-05-12 DIAGNOSIS — Z9181 History of falling: Secondary | ICD-10-CM | POA: Diagnosis not present

## 2015-05-12 DIAGNOSIS — M6281 Muscle weakness (generalized): Secondary | ICD-10-CM | POA: Diagnosis not present

## 2015-05-12 DIAGNOSIS — I951 Orthostatic hypotension: Secondary | ICD-10-CM | POA: Diagnosis not present

## 2015-05-25 ENCOUNTER — Telehealth: Payer: Self-pay | Admitting: Urology

## 2015-05-25 DIAGNOSIS — N2889 Other specified disorders of kidney and ureter: Secondary | ICD-10-CM

## 2015-05-25 NOTE — Telephone Encounter (Signed)
We need an order for an Ultrasound patient has a follow up appt on the 27th   michelle

## 2015-05-25 NOTE — Telephone Encounter (Signed)
Cory Robles, Cory Robles daughter called saying he needs an Ultra Sound before his appointment on April 27th, 2017. She called the scheduling number but was told there wasn't an order in for him. She's wondering when an order will be placed so she can try to schedule him on April 24th. She'd like a phone call regarding this.

## 2015-05-26 NOTE — Telephone Encounter (Signed)
Orders have been placed.

## 2015-06-08 ENCOUNTER — Ambulatory Visit: Payer: Medicare Other

## 2015-06-11 ENCOUNTER — Ambulatory Visit: Payer: Self-pay

## 2015-06-16 ENCOUNTER — Ambulatory Visit: Payer: Medicare Other

## 2015-06-19 ENCOUNTER — Ambulatory Visit: Payer: Self-pay

## 2015-06-22 ENCOUNTER — Ambulatory Visit
Admission: RE | Admit: 2015-06-22 | Discharge: 2015-06-22 | Disposition: A | Discharge status: Home / Self Care | Payer: Medicare Other | Source: Ambulatory Visit | Attending: Urology | Admitting: Urology

## 2015-06-22 DIAGNOSIS — Q6102 Congenital multiple renal cysts: Secondary | ICD-10-CM | POA: Insufficient documentation

## 2015-06-22 DIAGNOSIS — N2889 Other specified disorders of kidney and ureter: Secondary | ICD-10-CM | POA: Diagnosis not present

## 2015-06-22 DIAGNOSIS — R938 Abnormal findings on diagnostic imaging of other specified body structures: Secondary | ICD-10-CM | POA: Insufficient documentation

## 2015-06-25 ENCOUNTER — Ambulatory Visit: Payer: Self-pay

## 2015-07-16 ENCOUNTER — Ambulatory Visit: Payer: Self-pay

## 2015-07-17 ENCOUNTER — Other Ambulatory Visit: Payer: Self-pay

## 2015-08-20 ENCOUNTER — Ambulatory Visit: Payer: Self-pay

## 2015-09-08 ENCOUNTER — Emergency Department
Admission: EM | Admit: 2015-09-08 | Discharge: 2015-09-08 | Disposition: A | Discharge status: Home / Self Care | Payer: Medicare Other | Attending: Emergency Medicine | Admitting: Emergency Medicine

## 2015-09-08 DIAGNOSIS — R2242 Localized swelling, mass and lump, left lower limb: Secondary | ICD-10-CM | POA: Diagnosis not present

## 2015-09-08 DIAGNOSIS — N50812 Left testicular pain: Secondary | ICD-10-CM | POA: Diagnosis present

## 2015-09-08 DIAGNOSIS — I10 Essential (primary) hypertension: Secondary | ICD-10-CM | POA: Insufficient documentation

## 2015-09-08 DIAGNOSIS — Z87891 Personal history of nicotine dependence: Secondary | ICD-10-CM | POA: Insufficient documentation

## 2015-09-08 DIAGNOSIS — R197 Diarrhea, unspecified: Secondary | ICD-10-CM | POA: Diagnosis not present

## 2015-09-08 DIAGNOSIS — Z7982 Long term (current) use of aspirin: Secondary | ICD-10-CM | POA: Diagnosis not present

## 2015-09-08 DIAGNOSIS — K4090 Unilateral inguinal hernia, without obstruction or gangrene, not specified as recurrent: Secondary | ICD-10-CM | POA: Insufficient documentation

## 2015-09-08 LAB — COMPREHENSIVE METABOLIC PANEL
ALBUMIN: 3 g/dL — AB (ref 3.5–5.0)
ALK PHOS: 72 U/L (ref 38–126)
ALT: 11 U/L — ABNORMAL LOW (ref 17–63)
AST: 27 U/L (ref 15–41)
Anion gap: 7 (ref 5–15)
BILIRUBIN TOTAL: 1.7 mg/dL — AB (ref 0.3–1.2)
BUN: 13 mg/dL (ref 6–20)
CALCIUM: 8.7 mg/dL — AB (ref 8.9–10.3)
CO2: 30 mmol/L (ref 22–32)
Chloride: 97 mmol/L — ABNORMAL LOW (ref 101–111)
Creatinine, Ser: 0.74 mg/dL (ref 0.61–1.24)
GFR calc non Af Amer: >60 mL/min (ref >60)
GLUCOSE: 77 mg/dL (ref 65–99)
Potassium: 3.1 mmol/L — ABNORMAL LOW (ref 3.5–5.1)
Sodium: 134 mmol/L — ABNORMAL LOW (ref 135–145)
TOTAL PROTEIN: 6.6 g/dL (ref 6.5–8.1)

## 2015-09-08 LAB — CBC WITH DIFFERENTIAL/PLATELET
Basophils Absolute: 0.1 10*3/uL (ref 0–0.1)
Basophils Relative: 0 %
Eosinophils Absolute: 0.1 10*3/uL (ref 0–0.7)
Eosinophils Relative: 1 %
HEMATOCRIT: 35.3 % — AB (ref 40.0–52.0)
HEMOGLOBIN: 12.2 g/dL — AB (ref 13.0–18.0)
LYMPHS ABS: 1 10*3/uL (ref 1.0–3.6)
LYMPHS PCT: 9 %
MCH: 31.2 pg (ref 26.0–34.0)
MCHC: 34.7 g/dL (ref 32.0–36.0)
MCV: 89.9 fL (ref 80.0–100.0)
MONOS PCT: 13 %
Monocytes Absolute: 1.6 10*3/uL — ABNORMAL HIGH (ref 0.2–1.0)
NEUTROS ABS: 9.5 10*3/uL — AB (ref 1.4–6.5)
NEUTROS PCT: 77 %
Platelets: 258 10*3/uL (ref 150–440)
RBC: 3.93 MIL/uL — ABNORMAL LOW (ref 4.40–5.90)
RDW: 15.6 % — ABNORMAL HIGH (ref 11.5–14.5)
WBC: 12.3 10*3/uL — ABNORMAL HIGH (ref 3.8–10.6)

## 2015-09-08 LAB — LIPASE, BLOOD: Lipase: 19 U/L (ref 11–51)

## 2015-09-08 NOTE — ED Provider Notes (Signed)
[facility] Emergency Department Provider Note   ____________________________________________  Time seen: Approximately 5 PM  I have reviewed the triage vital signs and the nursing notes.   HISTORY  Chief Complaint Knee Pain; Ankle Pain; and Testicle Pain    HPI Cory Robles is a 82 y.o. male with a history of a left-sided renal mass as well as polycythemia vera and atrial fibrillation. He is presenting to the emergency department today for swelling of his testicles as well as lower abdomen. He is denying any pain despite what his triage note says. I asked him specifically about his left ankle he denies pain to the left ankle. He is here with a caregiver who also says that he has had a worsening swelling of the right side of his face where he has had a nodule since she has known him since February. She says that he has also had 3 episodes of diarrhea every day this week. She says that it is black. Denies any recent antibiotics.   Past Medical History:  Diagnosis Date  . Erythrocytosis   . Hypertension   . PAF (paroxysmal atrial fibrillation) ([place])    a. Noted on EKG in 06/2014. b. Again noted on EKG in 03/2015 and on telemetry.  . Polycythemia vera ([place]) 04/16/2015  . Renal mass 11/2014   Left    Patient Active Problem List   Diagnosis Date Noted  . Polycythemia vera ([place]) 04/16/2015  . PAF (paroxysmal atrial fibrillation) ([place]) 03/19/2015  . Protein-calorie malnutrition, severe 03/19/2015  . Fall   . Hyperkalemia   . Weakness   . Renal mass   . Persistent atrial fibrillation ([place])   . Orthostatic hypotension   . Hyponatremia 03/18/2015  . Erythrocytosis 10/30/2014  . Confusion 07/05/2014    History reviewed. No pertinent surgical history.  Current Outpatient Rx  . Order #: [id number] Class: Historical Med  . Order #: [id number] Class: Print  . Order #: [id number] Class: Historical Med  . Order #: [id number] Class: Historical Med  . Order #:  [id number] Class: Historical Med  . Order #: [id number] Class: No Print  . Order #: [id number] Class: Historical Med    Allergies Review of patient's allergies indicates no known allergies.  Family History  Problem Relation Age of Onset  . Hypertension Father   . Urolithiasis Neg Hx   . Kidney disease Neg Hx   . Kidney cancer Neg Hx   . Prostate cancer Neg Hx     Social History Social History  Substance Use Topics  . Smoking status: Former Research scientist (life sciences)  . Smokeless tobacco: Current User    Types: Chew  . Alcohol use 8.4 oz/week    14 Cans of beer per week     Comment: 1-2 beers a day    Review of Systems Constitutional: No fever/chills Eyes: No visual changes. ENT: No sore throat. Cardiovascular: Denies chest pain. Respiratory: Denies shortness of breath. Gastrointestinal: No abdominal pain.  No nausea, no vomiting.  No constipation. Genitourinary: Negative for dysuria. Musculoskeletal: Negative for back pain. Skin: Negative for rash. Neurological: Negative for headaches, focal weakness or numbness.  10-point ROS otherwise negative.  ____________________________________________   PHYSICAL EXAM:  VITAL SIGNS: ED Triage Vitals  Enc Vitals Group     BP 09/08/15 1554 123/81     Pulse Rate 09/08/15 1554 96     Resp 09/08/15 1554 18     Temp 09/08/15 1554 98.2 F (36.8 C)     Temp Source 09/08/15 1554 Oral  SpO2 09/08/15 1554 100 %     Weight 09/08/15 1554 127 lb (57.6 kg)     Height 09/08/15 1554 5\' 7"  (1.702 m)     Head Circumference --      Peak Flow --      Pain Score 09/08/15 1557 0     Pain Loc --      Pain Edu? --      Excl. in [place]? --     Constitutional: Alert and oriented. Well appearing and in no acute distress. Eyes: Conjunctivae are normal. PERRL. EOMI. Head: Atraumatic. Nose: No congestion/rhinnorhea. Mouth/Throat: Mucous membranes are moist.   Neck: No stridor.   Cardiovascular: Normal rate, regular rhythm. Grossly normal heart sounds.  Good  peripheral circulation. Respiratory: Normal respiratory effort.  No retractions. Lungs CTAB. Gastrointestinal: Soft and nontender. Mass to the left lower quadrant and groin of the abdomen. The mass is about 10 cm in circumference. It is soft and nontender and easily reducible consistent with hernia. Rectal exam with dark green stool which is heme-negative. Genitourinary: Testicles are normal in size bilaterally. Easily retractable foreskin. No tenderness or masses to the testicles. Musculoskeletal: Mild bilateral lower extremity edema. Neurologic:  Normal speech and language. No gross focal neurologic deficits are appreciated.  Skin:  Skin is warm, dry.  2 cm circumferential ulcer to the left lateral heel without any surrounding erythema, pus or induration. It is also nontender. Caregiver says that this wound has improved with care. Right-sided mass overlying the right region of the parotid gland. It is firm but mobile. Nontender. Nonfluctuant. Psychiatric: Mood and affect are normal. Speech and behavior are normal.  ____________________________________________   LABS (all labs ordered are listed, but only abnormal results are displayed)  Labs Reviewed  CBC WITH DIFFERENTIAL/PLATELET - Abnormal; Notable for the following:       Result Value   WBC 12.3 (*)    RBC 3.93 (*)    Hemoglobin 12.2 (*)    HCT 35.3 (*)    RDW 15.6 (*)    Neutro Abs 9.5 (*)    Monocytes Absolute 1.6 (*)    All other components within normal limits  COMPREHENSIVE METABOLIC PANEL - Abnormal; Notable for the following:    Sodium 134 (*)    Potassium 3.1 (*)    Chloride 97 (*)    Calcium 8.7 (*)    Albumin 3.0 (*)    ALT 11 (*)    Total Bilirubin 1.7 (*)    All other components within normal limits  GASTROINTESTINAL PANEL BY PCR, STOOL (REPLACES STOOL CULTURE)  C DIFFICILE QUICK SCREEN W PCR REFLEX  LIPASE, BLOOD    ____________________________________________  EKG   ____________________________________________  RADIOLOGY   ____________________________________________   PROCEDURES  Procedures  ____________________________________________   INITIAL IMPRESSION / ASSESSMENT AND PLAN / ED COURSE  Pertinent labs & imaging results that were available during my care of the patient were reviewed by me and considered in my medical decision making (see chart for details).  ----------------------------------------- 8:19 PM on 09/08/2015 -----------------------------------------  Patient resting comfortably at this time and continues to be without any complaints. I discussed extensively with both his caretaker and his family members the management of the hernia. The patient realizes that he will need to follow-up at the [facility] for further evaluation. We also discussed return precautions including herniated becoming painful or fixed and hard. The patient as well as the family understanding of this plan and are willing to comply. The patient has not  had any episodes of diarrhea in the emergency department here. The plan originally was to do stool studies but without a stool sample were unable to run them. Furthermore, I am less suspicious of a severe cause of the diarrhea with the patient's less frequent stools. He has some minor [employer] disturbances but I feel that he will be appropriate for follow-up at the [facility] with both his primary care doctor, his psychologist as well as a Psychologist, sport and exercise at the [place]. Furthermore, according to the caretaker the patient has had the right-sided facial mass in February but has been slowly increasing in size. It is possible this could be a malignancy but I feel that the patient will be best served by his oncologist at the [facility].  Clinical Course     ____________________________________________   FINAL CLINICAL IMPRESSION(S) / ED DIAGNOSES  Left groin  hernia. Diarrhea.    NEW MEDICATIONS STARTED DURING THIS VISIT:  New Prescriptions   No medications on file     Note:  This document was prepared using Dragon voice recognition software and may include unintentional dictation errors.    Orbie Pyo, MD 09/08/15 [phone]

## 2015-09-08 NOTE — ED Triage Notes (Addendum)
Pt bib EMS w/ c/o sore on L ankle, swelling to L knee and testicles.  Pt denies pain, CP, SOB, LOC, n/v/d or dizziness.  Pt does have sore to L ankle, approx 3/4" that he has been receiving treatment via home health nurse.  Pt also has nodule to R side of face, pt denies pain.  Pt does having swelling to L lower extremity, no pain.  Pts caregiver sts that pt has had diarrhea for 1 week and that pts testicles started swelling today

## 2015-09-08 NOTE — ED Notes (Signed)
Pt unable to give stool sample at this time.

## 2015-09-17 ENCOUNTER — Ambulatory Visit (INDEPENDENT_AMBULATORY_CARE_PROVIDER_SITE_OTHER): Payer: Medicare Other | Admitting: Urology

## 2015-09-17 ENCOUNTER — Encounter: Payer: Self-pay | Admitting: Urology

## 2015-09-17 VITALS — BP 121/72 | HR 102 | Ht 69.0 in | Wt 140.0 lb

## 2015-09-17 DIAGNOSIS — N2889 Other specified disorders of kidney and ureter: Secondary | ICD-10-CM

## 2015-09-17 NOTE — Progress Notes (Signed)
09/17/2015 9:27 AM   Jeanell Sparrow Norm Parcel September 07, 1929 [id number]  Referring provider: Linus Mako, NP [address] [place], [place] [address]  Chief Complaint  Patient presents with  . Follow-up    diagnostic study [place] results     HPI: The patient is a 82 y.o. gentleman with multiple medical comorbidities including walking with a walker and dementia and is a poor surgical candidate follows up for CT scan results. His CT shows a left peripelvic  mass at hypervascular near the hilum. The patient is asymptomatic from it at this time. He also has a very poor nutritional status only eating a few cans of ensure a day. He walks with a walker. He is adamantly refusing any surgery at this time.    On repeat renal u/s, the renal u/s shows the left renal mass to have increased to 4.4 cm from 4.2 cm 6 months ago.  Since his last visit, he has been to the emergency room with altered mental status.   The patient is transferred off his care to the [place].  PMH: Past Medical History:  Diagnosis Date  . Erythrocytosis   . Hypertension   . PAF (paroxysmal atrial fibrillation) ([place])    a. Noted on EKG in 06/2014. b. Again noted on EKG in 03/2015 and on telemetry.  . Polycythemia vera ([place]) 04/16/2015  . Renal mass 11/2014   Left    Surgical History: No past surgical history on file.  Home Medications:    Medication List       Accurate as of 09/17/15  9:27 AM. Always use your most recent med list.          acetaminophen 500 MG tablet Commonly known as:  TYLENOL Take 500 mg by mouth every 6 (six) hours as needed.   ALKA-SELTZER ANTACID PO Take 1 Dose by mouth daily as needed (for upset stomach).   amLODipine 10 MG tablet Commonly known as:  NORVASC Take 5 mg by mouth daily.   aspirin 81 MG chewable tablet Chew 1 tablet (81 mg total) by mouth daily.   atenolol 50 MG tablet Commonly known as:  TENORMIN Take 50 mg by mouth daily.   cholecalciferol 1000 units tablet Commonly known as:   VITAMIN D Take 1,000 Units by mouth daily.   feeding supplement (ENSURE ENLIVE) Liqd Take 237 mLs by mouth 3 (three) times daily between meals.   tamsulosin 0.4 MG Caps capsule Commonly known as:  FLOMAX Take 0.4 mg by mouth.       Allergies: No Known Allergies  Family History: Family History  Problem Relation Age of Onset  . Hypertension Father   . Urolithiasis Neg Hx   . Kidney disease Neg Hx   . Kidney cancer Neg Hx   . Prostate cancer Neg Hx     Social History:  reports that he has quit smoking. His smokeless tobacco use includes Chew. He reports that he drinks about 0.6 oz of alcohol per week . He reports that he does not use drugs.  ROS: UROLOGY Frequent Urination?: No Hard to postpone urination?: No Burning/pain with urination?: No Get up at night to urinate?: No Leakage of urine?: No Urine stream starts and stops?: No Trouble starting stream?: No Do you have to strain to urinate?: No Blood in urine?: No Urinary tract infection?: No Sexually transmitted disease?: No Injury to kidneys or bladder?: No Painful intercourse?: No Weak stream?: No Erection problems?: No Penile pain?: No  Gastrointestinal Nausea?: No Vomiting?: No  Indigestion/heartburn?: No Diarrhea?: No Constipation?: No  Constitutional Fever: No Night sweats?: No Weight loss?: No Fatigue?: No  Skin Skin rash/lesions?: No Itching?: No  Eyes Blurred vision?: No Double vision?: No  Ears/Nose/Throat Sore throat?: No Sinus problems?: No  Hematologic/Lymphatic Swollen glands?: No Easy bruising?: No  Cardiovascular Leg swelling?: No Chest pain?: No  Respiratory Cough?: No Shortness of breath?: No  Endocrine Excessive thirst?: No  Musculoskeletal Back pain?: No Joint pain?: No  Neurological Headaches?: No Dizziness?: No  Psychologic Depression?: No Anxiety?: No  Physical Exam: BP 121/72   Pulse (!) 102   Ht 5\' 9"  (1.753 m)   Wt 140 lb (63.5 kg)   BMI  20.67 kg/m   Constitutional:  Alert and oriented, No acute distress. HEENT: [place] AT, moist mucus membranes.  Trachea midline, no masses. Cardiovascular: No clubbing, cyanosis, or edema. Respiratory: Normal respiratory effort, no increased work of breathing. GI: Abdomen is soft, nontender, nondistended, no abdominal masses GU: No CVA tenderness.  Skin: No rashes, bruises or suspicious lesions. Lymph: No cervical or inguinal adenopathy. Neurologic: Grossly intact, no focal deficits, moving all 4 extremities. Psychiatric: Normal mood and affect.  Laboratory Data: Lab Results  Component Value Date   WBC 12.3 (H) 09/08/2015   HGB 12.2 (L) 09/08/2015   HCT 35.3 (L) 09/08/2015   MCV 89.9 09/08/2015   PLT 258 09/08/2015    Lab Results  Component Value Date   CREATININE 0.74 09/08/2015    No results found for: PSA  No results found for: TESTOSTERONE  No results found for: HGBA1C  Urinalysis    Component Value Date/Time   COLORURINE YELLOW (A) 03/18/2015 1224   APPEARANCEUR CLEAR (A) 03/18/2015 1224   APPEARANCEUR Clear 11/27/2014 0911   LABSPEC 1.013 03/18/2015 1224   PHURINE 5.0 03/18/2015 1224   GLUCOSEU NEGATIVE 03/18/2015 1224   HGBUR NEGATIVE 03/18/2015 [place] 03/18/2015 1224   BILIRUBINUR Negative 11/27/2014 0911   KETONESUR NEGATIVE 03/18/2015 1224   PROTEINUR NEGATIVE 03/18/2015 1224   NITRITE NEGATIVE 03/18/2015 1224   LEUKOCYTESUR NEGATIVE 03/18/2015 1224   LEUKOCYTESUR Negative 11/27/2014 0911    Pertinent Imaging: CLINICAL DATA:  Known left renal mass  EXAM: RENAL / URINARY TRACT ULTRASOUND COMPLETE  COMPARISON:  CT abdomen and pelvis December 10, 2014  FINDINGS: Right Kidney:  Length: 11.5 cm. Echogenicity within normal limits. Renal cortical thickness is low normal. No perinephric fluid or hydronephrosis visualized. There is a cyst arising from the lower pole of the right kidney measuring 3.7 x 4.3 x 4.2 cm. There is a cyst  in the midportion of the right kidney measuring 1.0 x 1.1 x 1.0 cm. No sonographically demonstrable calculus or ureterectasis.  Left Kidney:  Length: 9.1 cm. Echogenicity within normal limits. Renal cortical thickness low normal. No perinephric fluid or hydronephrosis visualized. The previously noted solid mass arising from the lower pole left kidney is moderately hypervascular and measures 2.7 x 3.8 x 4.4 cm. There is a cyst in the upper pole region measuring 2.0 x 2.4 x 2.2 cm. No sonographically demonstrable calculus or ureterectasis.  Bladder:  No urinary bladder mass. A portion of the urinary bladder extends toward the left inguinal region.  IMPRESSION: Persistent hypervascular mass arising from the lower pole left kidney which appears modestly larger compared to the previous study. Slow growing renal cell carcinoma is suspected on the left.  Cysts in each kidney.  Size discrepancy between kidneys. Significance of this finding is uncertain. This finding potentially could indicate  a degree of left renal artery stenosis. In this regard, question whether patient is hypertensive.  Urinary bladder extends toward the left inguinal region, also noted on prior CT. No urinary bladder mass evident by ultrasound.  These results will be called to the ordering clinician or representative by the Radiologist Assistant, and communication documented in the PACS or zVision Dashboard.    Assessment & Plan:    1. Left renal mass I again had a long discussion with the patient and his son regarding this mass. They do understand it is cancer until proven otherwise. Due to its location near the hilum, it is not amenable to a partial nephrectomy. The patient again continues to refuse surgery. This is not an unreasonable decision as the patient is a poor surgical candidate and a radical nephrectomy may lead to dialysis. He is also not interested in an interventional radiology consult  for cryoablation though I am not sure this lesions location is amenable to cryoablation. He is interested in transferring his entire medical care to the [place]. I would recommend that he continues to monitor this mass via renal ultrasound every 6 months. His records will be forwarded to the [place].   Nickie Retort, MD  [facility] [address], [place], [place] [address] [phone]

## 2015-10-22 ENCOUNTER — Other Ambulatory Visit: Payer: Self-pay

## 2015-10-22 ENCOUNTER — Ambulatory Visit: Payer: Self-pay | Admitting: Family Medicine

## 2015-10-23 ENCOUNTER — Ambulatory Visit: Payer: Self-pay | Admitting: Oncology

## 2015-10-23 ENCOUNTER — Other Ambulatory Visit: Payer: Self-pay

## 2016-05-18 DIAGNOSIS — R6889 Other general symptoms and signs: Secondary | ICD-10-CM | POA: Diagnosis not present

## 2016-06-14 DEATH — deceased
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
## 2017-12-12 IMAGING — CR DG CHEST 2V
2 series · 2 of 2 positions shown · non-contrast
Comparison: 07/04/2014

CLINICAL DATA: Dementia, altered mental status, fall today

EXAM:
CHEST  2 VIEW

[chest lat]
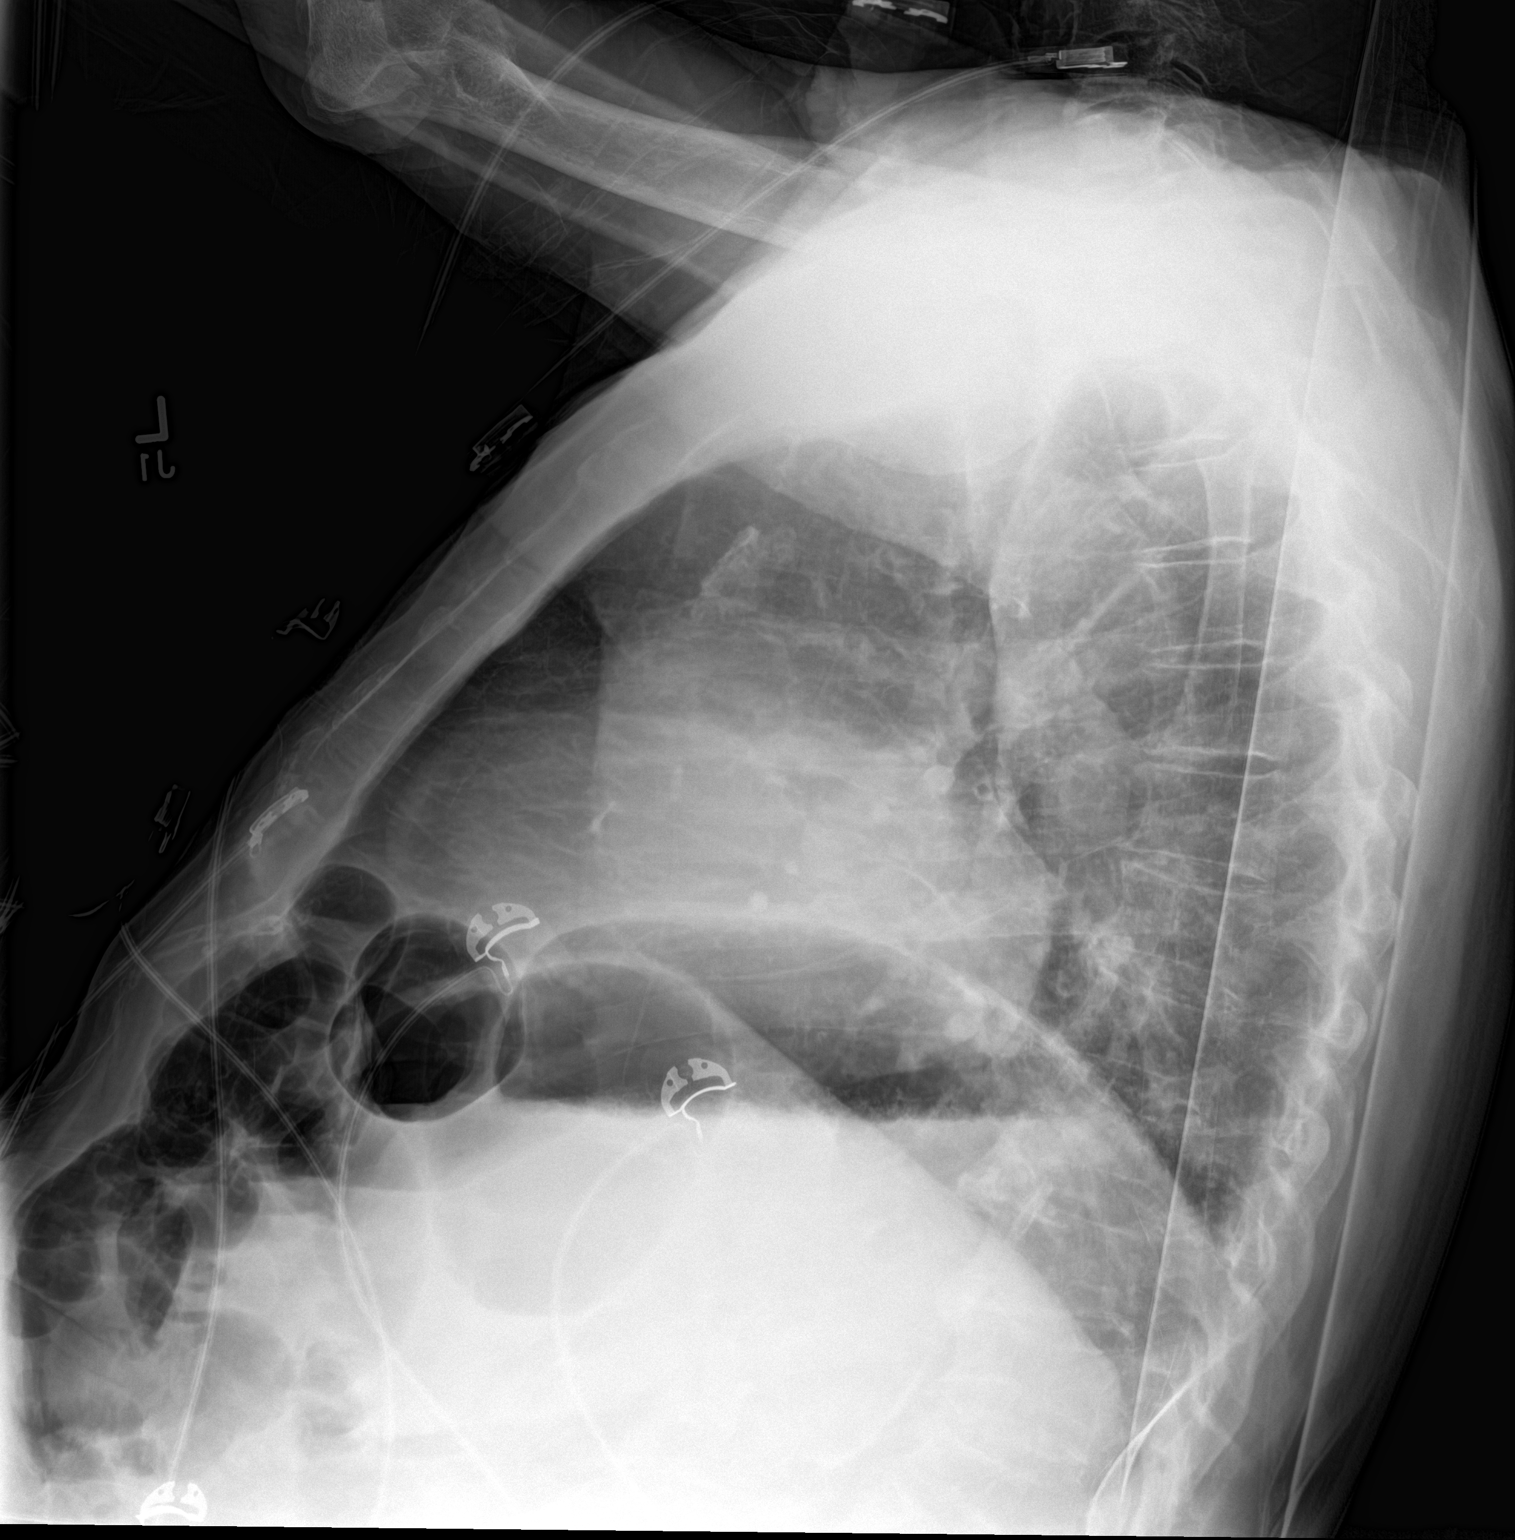

[chest ap]
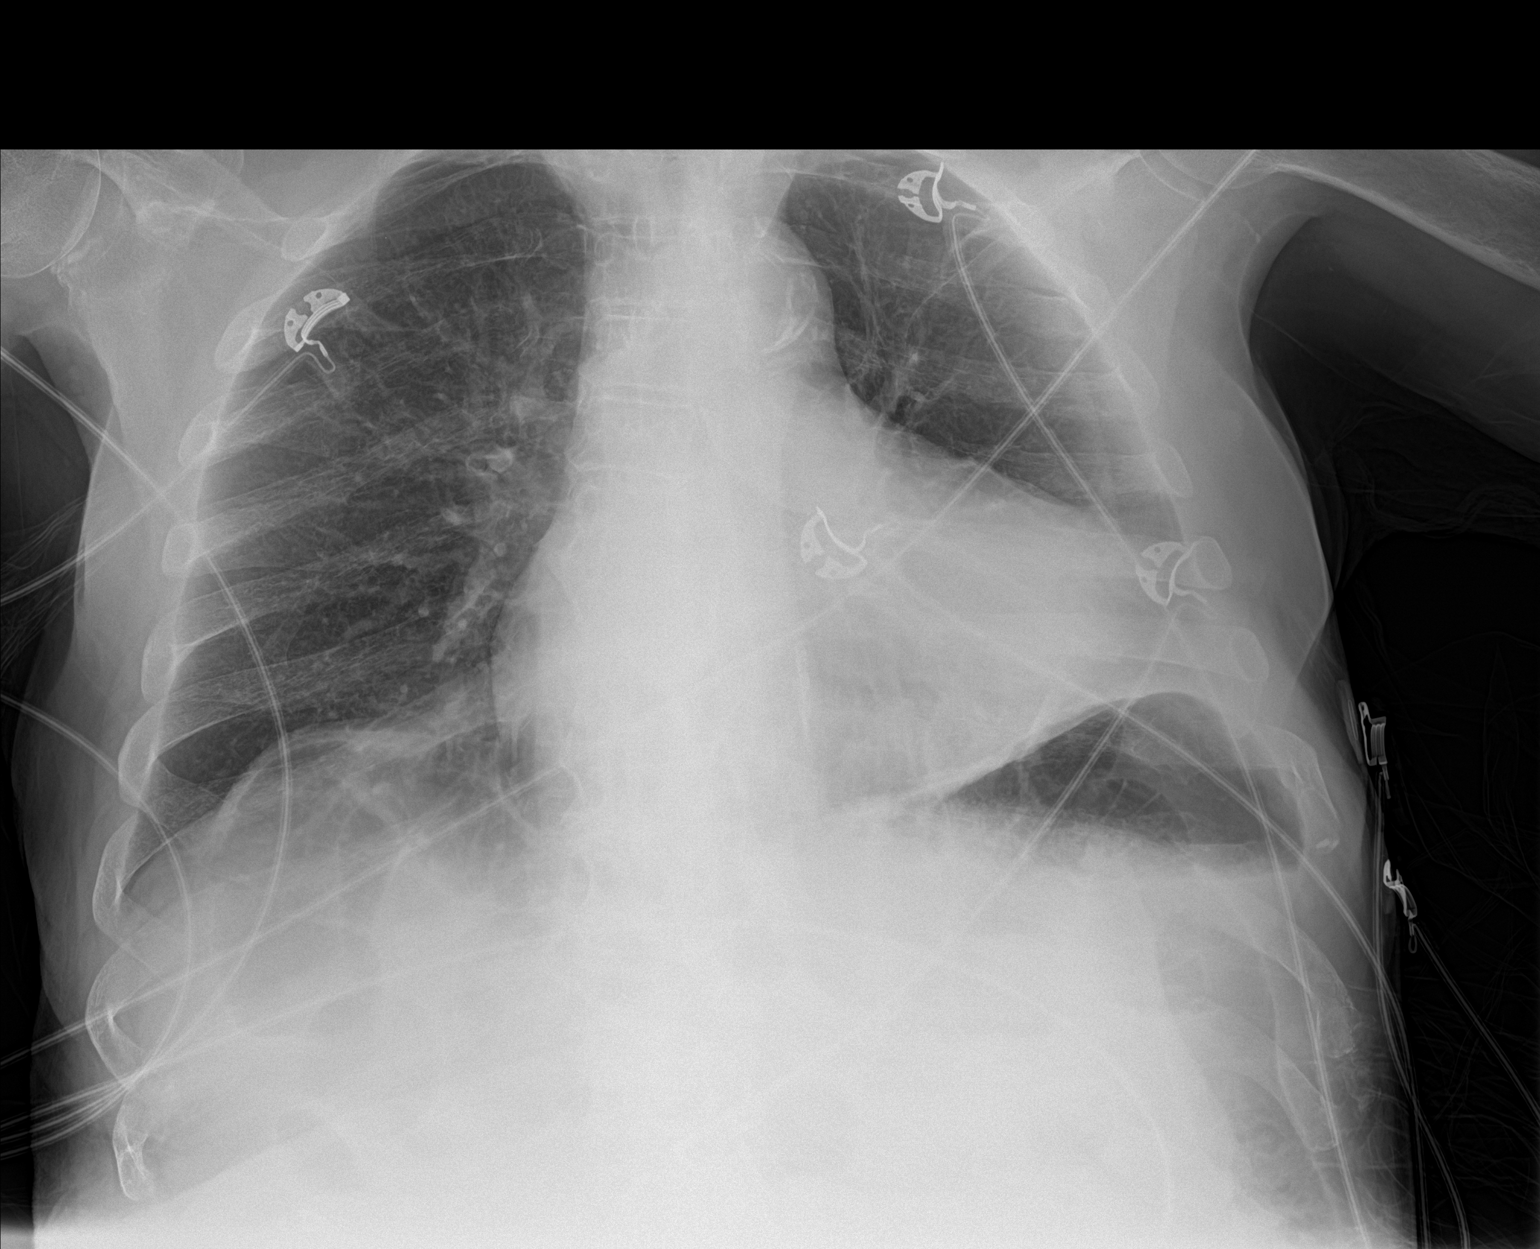

[2 of 2 positions shown; findings below may reference images not displayed]

FINDINGS: Cardiomegaly is noted. Mild elevation of the left hemidiaphragm. No
acute infiltrate or pulmonary edema. Osteopenia and mild
degenerative changes thoracolumbar spine. There is mild compression
deformity upper lumbar spine of indeterminate age. Clinical
correlation is necessary.
IMPRESSION: No acute infiltrate or pulmonary edema. Osteopenia and mild
degenerative changes thoracolumbar spine. There is mild compression
deformity upper lumbar spine of indeterminate age. Clinical
correlation is necessary.
